# Patient Record
Sex: Female | Born: 1990 | Race: Black or African American | Hispanic: No | Marital: Single | Smoking: Never smoker
Health system: Southern US, Community
[De-identification: ages and names within clinical notes are randomized; demographics above are authoritative.]

## PROBLEM LIST (undated history)

## (undated) ENCOUNTER — Inpatient Hospital Stay (HOSPITAL_COMMUNITY): Payer: Self-pay

## (undated) DIAGNOSIS — B999 Unspecified infectious disease: Secondary | ICD-10-CM

## (undated) DIAGNOSIS — S060XAA Concussion with loss of consciousness status unknown, initial encounter: Secondary | ICD-10-CM

## (undated) DIAGNOSIS — J302 Other seasonal allergic rhinitis: Secondary | ICD-10-CM

## (undated) DIAGNOSIS — J45909 Unspecified asthma, uncomplicated: Secondary | ICD-10-CM

## (undated) DIAGNOSIS — S060X9A Concussion with loss of consciousness of unspecified duration, initial encounter: Secondary | ICD-10-CM

## (undated) DIAGNOSIS — R7611 Nonspecific reaction to tuberculin skin test without active tuberculosis: Secondary | ICD-10-CM

## (undated) HISTORY — PX: NO PAST SURGERIES: SHX2092

## (undated) HISTORY — PX: WISDOM TOOTH EXTRACTION: SHX21

---
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
## 2012-01-01 DIAGNOSIS — J45909 Unspecified asthma, uncomplicated: Secondary | ICD-10-CM | POA: Insufficient documentation

## 2012-01-06 DIAGNOSIS — M4120 Other idiopathic scoliosis, site unspecified: Secondary | ICD-10-CM | POA: Insufficient documentation

## 2012-01-24 ENCOUNTER — Ambulatory Visit: Payer: Self-pay | Admitting: Nurse Practitioner

## 2013-05-17 ENCOUNTER — Encounter: Payer: Self-pay | Admitting: Obstetrics

## 2013-06-03 ENCOUNTER — Encounter (HOSPITAL_BASED_OUTPATIENT_CLINIC_OR_DEPARTMENT_OTHER): Payer: Self-pay | Admitting: Emergency Medicine

## 2013-06-03 ENCOUNTER — Ambulatory Visit (INDEPENDENT_AMBULATORY_CARE_PROVIDER_SITE_OTHER): Admitting: Obstetrics & Gynecology

## 2013-06-03 ENCOUNTER — Encounter: Payer: Self-pay | Admitting: Obstetrics & Gynecology

## 2013-06-03 ENCOUNTER — Emergency Department (HOSPITAL_BASED_OUTPATIENT_CLINIC_OR_DEPARTMENT_OTHER)

## 2013-06-03 ENCOUNTER — Emergency Department (HOSPITAL_BASED_OUTPATIENT_CLINIC_OR_DEPARTMENT_OTHER)
Admission: EM | Admit: 2013-06-03 | Discharge: 2013-06-04 | Disposition: A | Discharge status: Home / Self Care | Attending: Emergency Medicine | Admitting: Emergency Medicine

## 2013-06-03 VITALS — BP 116/71 | HR 73 | Temp 98.4°F | Wt 142.0 lb

## 2013-06-03 DIAGNOSIS — A499 Bacterial infection, unspecified: Secondary | ICD-10-CM | POA: Insufficient documentation

## 2013-06-03 DIAGNOSIS — N760 Acute vaginitis: Secondary | ICD-10-CM | POA: Insufficient documentation

## 2013-06-03 DIAGNOSIS — O98819 Other maternal infectious and parasitic diseases complicating pregnancy, unspecified trimester: Secondary | ICD-10-CM | POA: Insufficient documentation

## 2013-06-03 DIAGNOSIS — O43899 Other placental disorders, unspecified trimester: Secondary | ICD-10-CM

## 2013-06-03 DIAGNOSIS — J45909 Unspecified asthma, uncomplicated: Secondary | ICD-10-CM | POA: Insufficient documentation

## 2013-06-03 DIAGNOSIS — B9689 Other specified bacterial agents as the cause of diseases classified elsewhere: Secondary | ICD-10-CM | POA: Insufficient documentation

## 2013-06-03 DIAGNOSIS — O368990 Maternal care for other specified fetal problems, unspecified trimester, not applicable or unspecified: Secondary | ICD-10-CM | POA: Insufficient documentation

## 2013-06-03 DIAGNOSIS — Z87828 Personal history of other (healed) physical injury and trauma: Secondary | ICD-10-CM | POA: Insufficient documentation

## 2013-06-03 DIAGNOSIS — Z3400 Encounter for supervision of normal first pregnancy, unspecified trimester: Secondary | ICD-10-CM

## 2013-06-03 DIAGNOSIS — O2390 Unspecified genitourinary tract infection in pregnancy, unspecified trimester: Secondary | ICD-10-CM | POA: Insufficient documentation

## 2013-06-03 DIAGNOSIS — O468X1 Other antepartum hemorrhage, first trimester: Secondary | ICD-10-CM

## 2013-06-03 DIAGNOSIS — B3731 Acute candidiasis of vulva and vagina: Secondary | ICD-10-CM | POA: Insufficient documentation

## 2013-06-03 DIAGNOSIS — B373 Candidiasis of vulva and vagina: Secondary | ICD-10-CM | POA: Insufficient documentation

## 2013-06-03 DIAGNOSIS — O418X10 Other specified disorders of amniotic fluid and membranes, first trimester, not applicable or unspecified: Secondary | ICD-10-CM

## 2013-06-03 DIAGNOSIS — Z3490 Encounter for supervision of normal pregnancy, unspecified, unspecified trimester: Secondary | ICD-10-CM

## 2013-06-03 LAB — WET PREP, GENITAL: Trich, Wet Prep: NONE SEEN

## 2013-06-03 LAB — URINALYSIS, ROUTINE W REFLEX MICROSCOPIC
Bilirubin Urine: NEGATIVE
GLUCOSE, UA: NEGATIVE mg/dL
HGB URINE DIPSTICK: NEGATIVE
Ketones, ur: NEGATIVE mg/dL
Nitrite: NEGATIVE
Protein, ur: NEGATIVE mg/dL
Specific Gravity, Urine: 1.017 (ref 1.005–1.030)
UROBILINOGEN UA: 1 mg/dL (ref 0.0–1.0)
pH: 6.5 (ref 5.0–8.0)

## 2013-06-03 LAB — CBC WITH DIFFERENTIAL/PLATELET
Basophils Absolute: 0 10*3/uL (ref 0.0–0.1)
Basophils Relative: 0 % (ref 0–1)
EOS ABS: 0.3 10*3/uL (ref 0.0–0.7)
EOS PCT: 4 % (ref 0–5)
HEMATOCRIT: 32.3 % — AB (ref 36.0–46.0)
Hemoglobin: 11.8 g/dL — ABNORMAL LOW (ref 12.0–15.0)
LYMPHS ABS: 1.7 10*3/uL (ref 0.7–4.0)
LYMPHS PCT: 23 % (ref 12–46)
MCH: 31.2 pg (ref 26.0–34.0)
MCHC: 36.5 g/dL — ABNORMAL HIGH (ref 30.0–36.0)
MCV: 85.4 fL (ref 78.0–100.0)
MONO ABS: 0.6 10*3/uL (ref 0.1–1.0)
MONOS PCT: 8 % (ref 3–12)
NEUTROS ABS: 4.7 10*3/uL (ref 1.7–7.7)
Neutrophils Relative %: 65 % (ref 43–77)
PLATELETS: 222 10*3/uL (ref 150–400)
RBC: 3.78 MIL/uL — ABNORMAL LOW (ref 3.87–5.11)
RDW: 11.2 % — ABNORMAL LOW (ref 11.5–15.5)
WBC: 7.3 10*3/uL (ref 4.0–10.5)

## 2013-06-03 LAB — URINE MICROSCOPIC-ADD ON

## 2013-06-03 LAB — HCG, QUANTITATIVE, PREGNANCY: HCG, BETA CHAIN, QUANT, S: 94698 m[IU]/mL — AB (ref <5)

## 2013-06-03 LAB — ABO/RH: ABO/RH(D): O POS

## 2013-06-03 LAB — PREGNANCY, URINE: Preg Test, Ur: POSITIVE — AB

## 2013-06-03 MED ORDER — METRONIDAZOLE 500 MG PO TABS: 500.0000 mg | ORAL_TABLET | Freq: Two times a day (BID) | ORAL | Status: DC

## 2013-06-03 NOTE — ED Provider Notes (Signed)
TIME SEEN: 9:15 PM  CHIEF COMPLAINT: Abdominal cramping  HPI: Patient is a 23 year old G1 P0 with last menstrual period on March 12th who presents with lower abdominal cramping that started today. She's also had some pink vaginal discharge that may have been blood. She denies any dysuria or hematuria. No fevers, chills, nausea, vomiting or diarrhea. She's had a history of Chlamydia in the past that was treated. She has not yet had OB/GYN followup but has one scheduled. She has not had an ultrasound confirming an IUP.  ROS: See HPI Constitutional: no fever  Eyes: no drainage  ENT: no runny nose   Cardiovascular:  no chest pain  Resp: no SOB  GI: no vomiting GU: no dysuria Integumentary: no rash  Allergy: no hives  Musculoskeletal: no leg swelling  Neurological: no slurred speech ROS otherwise negative  PAST MEDICAL HISTORY/PAST SURGICAL HISTORY:  Past Medical History  Diagnosis Date  . Asthma   . Seasonal allergies   . Positive PPD, treated   . Concussion   . Complication of anesthesia     MEDICATIONS:  Prior to Admission medications   Medication Sig Start Date End Date Taking? Authorizing Provider  ipratropium-albuterol (DUONEB) 0.5-2.5 (3) MG/3ML SOLN Take 3 mLs by nebulization.    Historical Provider, MD    ALLERGIES:  No Known Allergies  SOCIAL HISTORY:  History  Substance Use Topics  . Smoking status: Never Smoker   . Smokeless tobacco: Never Used  . Alcohol Use: No    FAMILY HISTORY: No family history on file.  EXAM: BP 121/75  Pulse 93  Temp(Src) 98.1 F (36.7 C) (Oral)  Resp 18  Ht 5\' 4"  (1.626 m)  Wt 143 lb (64.864 kg)  BMI 24.53 kg/m2  SpO2 99%  LMP 03/25/2013 CONSTITUTIONAL: Alert and oriented and responds appropriately to questions. Well-appearing; well-nourished HEAD: Normocephalic EYES: Conjunctivae clear, PERRL ENT: normal nose; no rhinorrhea; moist mucous membranes; pharynx without lesions noted NECK: Supple, no meningismus, no LAD   CARD: RRR; S1 and S2 appreciated; no murmurs, no clicks, no rubs, no gallops RESP: Normal chest excursion without splinting or tachypnea; breath sounds clear and equal bilaterally; no wheezes, no rhonchi, no rales,  ABD/GI: Normal bowel sounds; non-distended; soft, non-tender, no rebound, no guarding GU:  Normal external genitalia, patient has thick white vaginal discharge with a brownish color, cervix is closed and thick and high, no cervical motion tenderness, no genital lesions, no adnexal tenderness or fullness BACK:  The back appears normal and is non-tender to palpation, there is no CVA tenderness EXT: Normal ROM in all joints; non-tender to palpation; no edema; normal capillary refill; no cyanosis    SKIN: Normal color for age and race; warm NEURO: Moves all extremities equally PSYCH: The patient's mood and manner are appropriate. Grooming and personal hygiene are appropriate.  MEDICAL DECISION MAKING: Patient here with vaginal discharge and possible bleeding while pregnant. She is 10 weeks and 0 days by LMP. Have discussed with patient that concern for threatened abortion versus infection versus ectopic. Will obtain labs, urine, pelvic cultures and transvaginal ultrasound. Patient denies wanting pain medication at this time.  ED PROGRESS: Patient's labs are unremarkable. Her beta hCG is 94,698. Her urine shows small leukocytes but no other sign of infection. Her wet prep is positive for yeast and clue cells. She states she's been taking over-the-counter medication for yeast infection recently. Her ultrasound shows a single viable IUP with gestational age of [redacted] weeks 3 days, but a heart rate of  175. She does have a subchorionic hemorrhage. Have discussed this with patient importance for OB/GYN followup. She reports she is currently on prenatal vitamins. Discussed with patient that she continue over-the-counter medicines for her yeast infection. Given she does have clue cells, will also discharge  with prescription for Flagyl for bacterial vaginosis.  Patient's blood type is O+. She does not need RhoGAM.     Layla MawKristen N Ward, DO 06/03/13 [id number]

## 2013-06-03 NOTE — Discharge Instructions (Signed)
Bacterial Vaginosis Bacterial vaginosis is a vaginal infection that occurs when the normal balance of bacteria in the vagina is disrupted. It results from an overgrowth of certain bacteria. This is the most common vaginal infection in women of childbearing age. Treatment is important to prevent complications, especially in pregnant women, as it can cause a premature delivery. CAUSES  Bacterial vaginosis is caused by an increase in harmful bacteria that are normally present in smaller amounts in the vagina. Several different kinds of bacteria can cause bacterial vaginosis. However, the reason that the condition develops is not fully understood. RISK FACTORS Certain activities or behaviors can put you at an increased risk of developing bacterial vaginosis, including:  Having a new sex partner or multiple sex partners.  Douching.  Using an intrauterine device (IUD) for contraception. Women do not get bacterial vaginosis from toilet seats, bedding, swimming pools, or contact with objects around them. SIGNS AND SYMPTOMS  Some women with bacterial vaginosis have no signs or symptoms. Common symptoms include:  Grey vaginal discharge.  A fishlike odor with discharge, especially after sexual intercourse.  Itching or burning of the vagina and vulva.  Burning or pain with urination. DIAGNOSIS  Your health care provider will take a medical history and examine the vagina for signs of bacterial vaginosis. A sample of vaginal fluid may be taken. Your health care provider will look at this sample under a microscope to check for bacteria and abnormal cells. A vaginal pH test may also be done.  TREATMENT  Bacterial vaginosis may be treated with antibiotic medicines. These may be given in the form of a pill or a vaginal cream. A second round of antibiotics may be prescribed if the condition comes back after treatment.  HOME CARE INSTRUCTIONS   Only take over-the-counter or prescription medicines as  directed by your health care provider.  If antibiotic medicine was prescribed, take it as directed. Make sure you finish it even if you start to feel better.  Do not have sex until treatment is completed.  Tell all sexual partners that you have a vaginal infection. They should see their health care provider and be treated if they have problems, such as a mild rash or itching.  Practice safe sex by using condoms and only having one sex partner. SEEK MEDICAL CARE IF:   Your symptoms are not improving after 3 days of treatment.  You have increased discharge or pain.  You have a fever. MAKE SURE YOU:   Understand these instructions.  Will watch your condition.  Will get help right away if you are not doing well or get worse. FOR MORE INFORMATION  Centers for Disease Control and Prevention, Division of STD Prevention: [employer]raud[employer] American Sexual Health Association (ASHA): www.ashastd.org  Document Released: 12/31/2004 Document Revised: 10/21/2012 Document Reviewed: 08/12/2012 [facility] Patient Information 2014 [place].  Pregnancy - First Trimester During sexual intercourse, millions of sperm go into the vagina. Only 1 sperm will penetrate and fertilize the female egg while it is in the Fallopian tube. One week later, the fertilized egg implants into the wall of the uterus. An embryo begins to develop into a baby. At 6 to 8 weeks, the eyes and face are formed and the heartbeat can be seen on ultrasound. At the end of 12 weeks (first trimester), all the baby's organs are formed. Now that you are pregnant, you will want to do everything you can to have a healthy baby. Two of the most important things are to get good  prenatal care and follow your caregiver's instructions. Prenatal care is all the medical care you receive before the baby's birth. It is given to prevent, find, and treat problems during the pregnancy and childbirth. PRENATAL EXAMS  During prenatal visits, your weight,  blood pressure, and urine are checked. This is done to make sure you are healthy and progressing normally during the pregnancy.  A pregnant woman should gain 25 to 35 pounds during the pregnancy. However, if you are overweight or underweight, your caregiver will advise you regarding your weight.  Your caregiver will ask and answer questions for you.  Blood work, cervical cultures, other necessary tests, and a Pap test are done during your prenatal exams. These tests are done to check on your health and the probable health of your baby. Tests are strongly recommended and done for HIV with your permission. This is the virus that causes AIDS. These tests are done because medicines can be given to help prevent your baby from being born with this infection should you have been infected without knowing it. Blood work is also used to find out your blood type, previous infections, and follow your blood levels (hemoglobin).  Low hemoglobin (anemia) is common during pregnancy. Iron and vitamins are given to help prevent this. Later in the pregnancy, blood tests for diabetes will be done along with any other tests if any problems develop.  You may need other tests to make sure you and the baby are doing well. CHANGES DURING THE FIRST TRIMESTER  Your body goes through many changes during pregnancy. They vary from person to person. Talk to your caregiver about changes you notice and are concerned about. Changes can include:  Your menstrual period stops.  The egg and sperm carry the genes that determine what you look like. Genes from you and your partner are forming a baby. The female genes determine whether the baby is a boy or a girl.  Your body increases in girth and you may feel bloated.  Feeling sick to your stomach (nauseous) and throwing up (vomiting). If the vomiting is uncontrollable, call your caregiver.  Your breasts will begin to enlarge and become tender.  Your nipples may stick out more and  become darker.  The need to urinate more. Painful urination may mean you have a bladder infection.  Tiring easily.  Loss of appetite.  Cravings for certain kinds of food.  At first, you may gain or lose a couple of pounds.  You may have changes in your emotions from day to day (excited to be pregnant or concerned something may go wrong with the pregnancy and baby).  You may have more vivid and strange dreams. HOME CARE INSTRUCTIONS   It is very important to avoid all smoking, alcohol and non-prescribed drugs during your pregnancy. These affect the formation and growth of the baby. Avoid chemicals while pregnant to ensure the delivery of a healthy infant.  Start your prenatal visits by the 12th week of pregnancy. They are usually scheduled monthly at first, then more often in the last 2 months before delivery. Keep your caregiver's appointments. Follow your caregiver's instructions regarding medicine use, blood and lab tests, exercise, and diet.  During pregnancy, you are providing food for you and your baby. Eat regular, well-balanced meals. Choose foods such as meat, fish, milk and other low fat dairy products, vegetables, fruits, and whole-grain breads and cereals. Your caregiver will tell you of the ideal weight gain.  You can help morning sickness by keeping  soda crackers at the bedside. Eat a couple before arising in the morning. You may want to use the crackers without salt on them.  Eating 4 to 5 small meals rather than 3 large meals a day also may help the nausea and vomiting.  Drinking liquids between meals instead of during meals also seems to help nausea and vomiting.  A physical sexual relationship may be continued throughout pregnancy if there are no other problems. Problems may be early (premature) leaking of amniotic fluid from the membranes, vaginal bleeding, or belly (abdominal) pain.  Exercise regularly if there are no restrictions. Check with your caregiver or  physical therapist if you are unsure of the safety of some of your exercises. Greater weight gain will occur in the last 2 trimesters of pregnancy. Exercising will help:  Control your weight.  Keep you in shape.  Prepare you for labor and delivery.  Help you lose your pregnancy weight after you deliver your baby.  Wear a good support or jogging bra for breast tenderness during pregnancy. This may help if worn during sleep too.  Ask when prenatal classes are available. Begin classes when they are offered.  Do not use hot tubs, steam rooms, or saunas.  Wear your seat belt when driving. This protects you and your baby if you are in an accident.  Avoid raw meat, uncooked cheese, cat litter boxes, and soil used by cats throughout the pregnancy. These carry germs that can cause birth defects in the baby.  The first trimester is a good time to visit your dentist for your dental health. Getting your teeth cleaned is okay. Use a softer toothbrush and brush gently during pregnancy.  Ask for help if you have financial, counseling, or nutritional needs during pregnancy. Your caregiver will be able to offer counseling for these needs as well as refer you for other special needs.  Do not take any medicines or herbs unless told by your caregiver.  Inform your caregiver if there is any mental or physical domestic violence.  Make a list of emergency phone numbers of family, friends, hospital, and police and fire departments.  Write down your questions. Take them to your prenatal visit.  Do not douche.  Do not cross your legs.  If you have to stand for long periods of time, rotate you feet or take small steps in a circle.  You may have more vaginal secretions that may require a sanitary pad. Do not use tampons or scented sanitary pads. MEDICINES AND DRUG USE IN PREGNANCY  Take prenatal vitamins as directed. The vitamin should contain 1 milligram of folic acid. Keep all vitamins out of reach of  children. Only a couple vitamins or tablets containing iron may be fatal to a baby or young child when ingested.  Avoid use of all medicines, including herbs, over-the-counter medicines, not prescribed or suggested by your caregiver. Only take over-the-counter or prescription medicines for pain, discomfort, or fever as directed by your caregiver. Do not use aspirin, ibuprofen, or naproxen unless directed by your caregiver.  Let your caregiver also know about herbs you may be using.  Alcohol is related to a number of birth defects. This includes fetal alcohol syndrome. All alcohol, in any form, should be avoided completely. Smoking will cause low birth rate and premature babies.  Street or illegal drugs are very harmful to the baby. They are absolutely forbidden. A baby born to an addicted mother will be addicted at birth. The baby will go through the  same withdrawal an adult does.  Let your caregiver know about any medicines that you have to take and for what reason you take them. SEEK MEDICAL CARE IF:  You have any concerns or worries during your pregnancy. It is better to call with your questions if you feel they cannot wait, rather than worry about them. SEEK IMMEDIATE MEDICAL CARE IF:   An unexplained oral temperature above 102 F (38.9 C) develops, or as your caregiver suggests.  You have leaking of fluid from the vagina (birth canal). If leaking membranes are suspected, take your temperature and inform your caregiver of this when you call.  There is vaginal spotting or bleeding. Notify your caregiver of the amount and how many pads are used.  You develop a bad smelling vaginal discharge with a change in the color.  You continue to feel sick to your stomach (nauseated) and have no relief from remedies suggested. You vomit blood or coffee ground-like materials.  You lose more than 2 pounds of weight in 1 week.  You gain more than 2 pounds of weight in 1 week and you notice swelling of  your face, hands, feet, or legs.  You gain 5 pounds or more in 1 week (even if you do not have swelling of your hands, face, legs, or feet).  You get exposed to [place] measles and have never had them.  You are exposed to fifth disease or chickenpox.  You develop belly (abdominal) pain. Round ligament discomfort is a common non-cancerous (benign) cause of abdominal pain in pregnancy. Your caregiver still must evaluate this.  You develop headache, fever, diarrhea, pain with urination, or shortness of breath.  You fall or are in a car accident or have any kind of trauma.  There is mental or physical violence in your home. Document Released: 12/25/2000 Document Revised: 09/25/2011 Document Reviewed: 06/28/2008 [facility] Patient Information 2014 [employer] [place]. Threatened Miscarriage Bleeding during the first 20 weeks of pregnancy is common. This is sometimes called a threatened miscarriage. This is a pregnancy that is threatening to end before the twentieth week of pregnancy. Often this bleeding stops with bed rest or decreased activities as suggested by your caregiver and the pregnancy continues without any more problems. You may be asked to not have sexual intercourse, have orgasms or use tampons until further notice. Sometimes a threatened miscarriage can progress to a complete or incomplete miscarriage. This may or may not require further treatment. Some miscarriages occur before a woman misses a menstrual period and knows she is pregnant. Miscarriages occur in 15 to 20% of all pregnancies and usually occur during the first 13 weeks of the pregnancy. The exact cause of a miscarriage is usually never known. A miscarriage is natures way of ending a pregnancy that is abnormal or would not make it to term. There are some things that may put you at risk to have a miscarriage, such as:  Hormone problems.  Infection of the uterus or cervix.  Chronic illness, diabetes for example, especially if it  is not controlled.  Abnormal shaped uterus.  Fibroids in the uterus.  Incompetent cervix (the cervix is too weak to hold the baby).  Smoking.  Drinking too much alcohol. It's best not to drink any alcohol when you are pregnant.  Taking illegal drugs. TREATMENT  When a miscarriage becomes complete and all products of conception (all the tissue in the uterus) have been passed, often no treatment is needed. If you think you passed tissue, save it in  a container and take it to your doctor for evaluation. If the miscarriage is incomplete (parts of the fetus or placenta remain in the uterus), further treatment may be needed. The most common reason for further treatment is continued bleeding (hemorrhage) because pregnancy tissue did not pass out of the uterus. This often occurs if a miscarriage is incomplete. Tissue left behind may also become infected. Treatment usually is dilatation and curettage (the removal of the remaining products of pregnancy. This can be done by a simple sucking procedure (suction curettage) or a simple scraping of the inside of the uterus. This may be done in the hospital or in the caregiver's office. This is only done when your caregiver knows that there is no chance for the pregnancy to proceed to term. This is determined by physical examination, negative pregnancy test, falling pregnancy hormone count and/or, an ultrasound revealing a dead fetus. Miscarriages are often a very emotional time for prospective mothers and fathers. This is not you or your partners fault. It did not occur because of an inadequacy in you or your partner. Nearly all miscarriages occur because the pregnancy has started off wrongly. At least half of these pregnancies have a chromosomal abnormality. It is almost always not inherited. Others may have developmental problems with the fetus or placenta. This does not always show up even when the products miscarried are studied under the microscope. The  miscarriage is nearly always not your fault and it is not likely that you could have prevented it from happening. If you are having emotional and grieving problems, talk to your health care provider and even seek counseling, if necessary, before getting pregnant again. You can begin trying for another pregnancy as soon as your caregiver says it is OK. HOME CARE INSTRUCTIONS   Your caregiver may order bed rest depending on how much bleeding and cramping you are having. You may be limited to only getting up to go to the bathroom. You may be allowed to continue light activity. You may need to make arrangements for the care of your other children and for any other responsibilities.  Keep track of the number of pads you use each day, how often you have to change pads and how saturated (soaked) they are. Record this information.  DO NOT USE TAMPONS. Do not douche, have sexual intercourse or orgasms until approved by your caregiver.  You may receive a follow up appointment for re-evaluation of your pregnancy and a repeat blood test. Re-evaluation often occurs after 2 days and again in 4 to 6 weeks. It is very important that you follow-up in the recommended time period.  If you are Rh negative and the father is Rh positive or you do not know the fathers' blood type, you may receive a shot (Rh immune globulin) to help prevent abnormal antibodies that can develop and affect the baby in any future pregnancies. SEEK IMMEDIATE MEDICAL CARE IF:  You have severe cramps in your stomach, back, or abdomen.  You have a sudden onset of severe pain in the lower part of your abdomen.  You develop chills.  You run an unexplained temperature of 101 F (38.3 C) or higher.  You pass large clots or tissue. Save any tissue for your caregiver to inspect.  Your bleeding increases or you become light-headed, weak, or have fainting episodes.  You have a gush of fluid from your vagina.  You pass out. This could mean you  have a tubal (ectopic) pregnancy. Document Released: 12/31/2004 Document  Revised: 03/25/2011 Document Reviewed: 08/17/2007 [facility] Patient Information 2014 [place], [place].

## 2013-06-03 NOTE — ED Notes (Signed)
abd cramping off and on x 5 weeks,  Worse last 2.5 hours,  vad dc onset yesterday pinkish  And today dc was red

## 2013-06-04 LAB — GC/CHLAMYDIA PROBE AMP
CT PROBE, AMP APTIMA: NEGATIVE
GC PROBE AMP APTIMA: NEGATIVE

## 2013-06-08 ENCOUNTER — Telehealth: Payer: Self-pay | Admitting: *Deleted

## 2013-06-08 NOTE — Telephone Encounter (Signed)
Patients mother called and stated patient had been seen in the ER the night before for stomach pains, cramping and discharge. Patients mother states she would like to know what over the counter medication she could use for a yeast infection. Patients mother states that the ER told her over the counter treatment was fine. I called the patient and advised her to use Mono stat and also advised the patient that if symptoms did not improve to give [place] a call.

## 2013-06-21 ENCOUNTER — Encounter: Payer: Self-pay | Admitting: Obstetrics & Gynecology

## 2013-06-21 ENCOUNTER — Ambulatory Visit (INDEPENDENT_AMBULATORY_CARE_PROVIDER_SITE_OTHER): Admitting: Obstetrics & Gynecology

## 2013-06-21 VITALS — BP 131/71 | HR 75 | Temp 98.6°F | Wt 144.0 lb

## 2013-06-21 DIAGNOSIS — Z3400 Encounter for supervision of normal first pregnancy, unspecified trimester: Secondary | ICD-10-CM | POA: Insufficient documentation

## 2013-06-21 DIAGNOSIS — B379 Candidiasis, unspecified: Secondary | ICD-10-CM

## 2013-06-21 DIAGNOSIS — Z3201 Encounter for pregnancy test, result positive: Secondary | ICD-10-CM

## 2013-06-21 LAB — POCT URINALYSIS DIPSTICK
Blood, UA: NEGATIVE
Glucose, UA: NEGATIVE
Ketones, UA: NEGATIVE
Leukocytes, UA: NEGATIVE
Nitrite, UA: NEGATIVE
PH UA: 6
Protein, UA: NEGATIVE
Spec Grav, UA: 1.01

## 2013-06-21 MED ORDER — TERCONAZOLE 0.4 % VA CREA: 1.0000 | TOPICAL_CREAM | Freq: Every day | VAGINAL | Status: DC

## 2013-06-21 NOTE — Progress Notes (Signed)
Subjective:    Victoria Weber is being seen today for her first obstetrical visit.  This is not a planned pregnancy. She is at 7686w4d gestation. Relationship with FOB: significant other, not living together. Patient does intend to breast feed. Pregnancy history fully reviewed.  Menstrual History: OB History   Grav Para Term Preterm Abortions TAB SAB Ect Mult Living   1                Patient's last menstrual period was 03/25/2013.    Past Medical History  Diagnosis Date  . Asthma   . Seasonal allergies   . Positive PPD, treated   . Concussion     Past Surgical History  Procedure Laterality Date  . Wisdom tooth extraction       (Not in a hospital admission) No Known Allergies  History  Substance Use Topics  . Smoking status: Never Smoker   . Smokeless tobacco: Never Used  . Alcohol Use: No    History reviewed. No pertinent family history.   Review of Systems Constitutional: negative for weight loss Gastrointestinal: negative for vomiting Genitourinary:negative for genital lesions and vaginal discharge and dysuria Musculoskeletal:negative for back pain Behavioral/Psych: negative for abusive relationship, depression, illegal drug usage and tobacco use    Objective:     General Appearance:    Alert, cooperative, no distress, appears stated age  Head:    Normocephalic, without obvious abnormality, atraumatic  Eyes:    PERRL, conjunctiva/corneas clear, EOM's intact, fundi    benign, both eyes  Ears:    Normal TM's and external ear canals, both ears  Nose:   Nares normal, septum midline, mucosa normal, no drainage    or sinus tenderness  Throat:   Lips, mucosa, and tongue normal; teeth and gums normal  Neck:   Supple, symmetrical, trachea midline, no adenopathy;    thyroid:  no enlargement/tenderness/nodules; no carotid   bruit or JVD  Back:     Symmetric, no curvature, ROM normal, no CVA tenderness  Lungs:     Clear to auscultation bilaterally, respirations unlabored   Chest Wall:    No tenderness or deformity   Heart:    Regular rate and rhythm, S1 and S2 normal, no murmur, rub   or gallop  Breast Exam:    No tenderness, masses, or nipple abnormality  Abdomen:     Soft, non-tender, bowel sounds active all four quadrants,    no masses, no organomegaly  Genitalia:    Fissuring at 6 o'clock; cheesy white discharge  Extremities:   Extremities normal, atraumatic, no cyanosis or edema  Pulses:   2+ and symmetric all extremities  Skin:   Skin color, texture, turgor normal, no rashes or lesions  Lymph nodes:   Cervical, supraclavicular, and axillary nodes normal  Neurologic:   CNII-XII intact, normal strength, sensation and reflexes    throughout      Lab Review Urine pregnancy test Labs reviewed yes Radiologic studies reviewed yes Assessment:    Pregnancy at 3586w4d weeks   Likely candida vulvovaginitis Plan:      Prenatal vitamins.  Counseling provided regarding continued use of seat belts, cessation of alcohol consumption, smoking or use of illicit drugs; infection precautions i.e., influenza/TDAP immunizations, toxoplasmosis,CMV, parvovirus, listeria and varicella; workplace safety, exercise during pregnancy; routine dental care, safe medications, sexual activity, hot tubs, saunas, pools, travel, caffeine use, fish and methlymercury, potential toxins, hair treatments, varicose veins Weight gain recommendations per IOM guidelines reviewed: normal weight/BMI 18.5 - 24.9--> gain  25 - 35 lbs Problem list reviewed and updated. FIRST/CF mutation testing/NIPT/QUAD SCREEN/fragile X/Spinal muscular atrophy discussed. Role of ultrasound in pregnancy discussed. Amniocentesis discussed: not indicated. Testing for candida/BV/HSV Meds ordered this encounter  Medications  . Prenatal Multivit-Min-Fe-FA (PRENATAL VITAMINS PO)    Sig: Take by mouth.   Orders Placed This Encounter  Procedures  . Culture, OB Urine  . Obstetric panel  . HIV antibody  .  Hemoglobinopathy evaluation  . Vit D  25 hydroxy (rtn osteoporosis monitoring)  . Varicella zoster antibody, IgG  . POCT urinalysis dipstick    Follow up in 4 weeks.

## 2013-06-22 LAB — OBSTETRIC PANEL
Antibody Screen: NEGATIVE
BASOS ABS: 0 10*3/uL (ref 0.0–0.1)
Basophils Relative: 0 % (ref 0–1)
Eosinophils Absolute: 0.2 10*3/uL (ref 0.0–0.7)
Eosinophils Relative: 4 % (ref 0–5)
HCT: 34.3 % — ABNORMAL LOW (ref 36.0–46.0)
HEMOGLOBIN: 12.3 g/dL (ref 12.0–15.0)
HEP B S AG: NEGATIVE
Lymphocytes Relative: 23 % (ref 12–46)
Lymphs Abs: 1.3 10*3/uL (ref 0.7–4.0)
MCH: 30.8 pg (ref 26.0–34.0)
MCHC: 35.9 g/dL (ref 30.0–36.0)
MCV: 85.8 fL (ref 78.0–100.0)
MONOS PCT: 5 % (ref 3–12)
Monocytes Absolute: 0.3 10*3/uL (ref 0.1–1.0)
NEUTROS PCT: 68 % (ref 43–77)
Neutro Abs: 3.9 10*3/uL (ref 1.7–7.7)
Platelets: 226 10*3/uL (ref 150–400)
RBC: 4 MIL/uL (ref 3.87–5.11)
RDW: 13.2 % (ref 11.5–15.5)
Rh Type: POSITIVE
Rubella: 2.67 Index — ABNORMAL HIGH (ref <0.90)
WBC: 5.7 10*3/uL (ref 4.0–10.5)

## 2013-06-22 LAB — VARICELLA ZOSTER ANTIBODY, IGG: Varicella IgG: 1198 Index — ABNORMAL HIGH (ref <135.00)

## 2013-06-22 LAB — HIV ANTIBODY (ROUTINE TESTING W REFLEX): HIV 1&2 Ab, 4th Generation: NONREACTIVE

## 2013-06-22 LAB — VITAMIN D 25 HYDROXY (VIT D DEFICIENCY, FRACTURES): Vit D, 25-Hydroxy: 31 ng/mL (ref 30–89)

## 2013-06-22 NOTE — Patient Instructions (Signed)
Prenatal Care  WHAT IS PRENATAL CARE?  Prenatal care means health care during your pregnancy, before your baby is born. It is very important to take care of yourself and your baby during your pregnancy by:   Getting early prenatal care. If you know you are pregnant, or think you might be pregnant, call your health care provider as soon as possible. Schedule a visit for a prenatal exam.  Getting regular prenatal care. Follow your health care provider's schedule for blood and other necessary tests. Do not miss appointments.  Doing everything you can to keep yourself and your baby healthy during your pregnancy.  Getting complete care. Prenatal care should include evaluation of the medical, dietary, educational, psychological, and social needs of you and your significant other. The medical and genetic history of your family and the family of your baby's father should be discussed with your health care provider.  Discussing with your health care provider:  Prescription, over-the-counter, and herbal medicines that you take.  Any history of substance abuse, alcohol use, smoking, and illegal drug use.  Any history of domestic abuse and violence.  Immunizations you have received.  Your nutrition and diet.  The amount of exercise you do.  Any environmental and occupational hazards to which you are exposed.  History of sexually transmitted infections for both you and your partner.  Previous pregnancies you have had. WHY IS PRENATAL CARE SO IMPORTANT?  By regularly seeing your health care provider, you help ensure that problems can be identified early so that they can be treated as soon as possible. Other problems might be prevented. Many studies have shown that early and regular prenatal care is important for the health of mothers and their babies.  HOW CAN I TAKE CARE OF MYSELF WHILE I AM PREGNANT?  Here are ways to take care of yourself and your baby:   Start or continue taking your  multivitamin with 400 micrograms (mcg) of folic acid every day.  Get early and regular prenatal care. It is very important to see a health care provider during your pregnancy. Your health care provider will check at each visit to make sure that you and the baby are healthy. If there are any problems, action can be taken right away to help you and the baby.  Eat a healthy diet that includes:  Fruits.  Vegetables.  Foods low in saturated fat.  Whole grains.  Calcium-rich foods, such as milk, yogurt, and hard cheeses.  Drink 6 to 8 glasses of liquids a day.  Unless your health care provider tells you not to, try to be physically active for 30 minutes, most days of the week. If you are pressed for time, you can get your activity in through 10-minute segments, three times a day.  Do not smoke, drink alcohol, or use drugs. These can cause long-term damage to your baby. Talk with your health care provider about steps to take to stop smoking. Talk with a member of your faith community, a counselor, a trusted friend, or your health care provider if you are concerned about your alcohol or drug use.  Ask your health care provider before taking any medicine, even over-the-counter medicines. Some medicines are not safe to take during pregnancy.  Get plenty of rest and sleep.  Avoid hot tubs and saunas during pregnancy.  Do not have X-rays taken unless absolutely necessary and with the recommendation of your health care provider. A lead shield can be placed on your abdomen to protect the  baby when X-rays are taken in other parts of the body.  Do not empty the cat litter when you are pregnant. It may contain a parasite that causes an infection called toxoplasmosis, which can cause birth defects. Also, use gloves when working in garden areas used by cats.  Do not eat uncooked or undercooked meats or fish.  Do not eat soft, mold-ripened cheeses (Brie, Camembert, and chevre) or soft, blue-veined  cheese (Danish blue and Roquefort).  Stay away from toxic chemicals like:  Insecticides.  Solvents (some cleaners or paint thinners).  Lead.  Mercury.  Sexual intercourse may continue until the end of the pregnancy, unless you have a medical problem or there is a problem with the pregnancy and your health care provider tells you not to.  Do not wear high-heel shoes, especially during the second half of the pregnancy. You can lose your balance and fall.  Do not take long trips, unless absolutely necessary. Be sure to see your health care provider before going on the trip.  Do not sit in one position for more than 2 hours when on a trip.  Take a copy of your medical records when going on a trip. Know where a hospital is located in the city you are visiting, in case of an emergency.  Most dangerous household products will have pregnancy warnings on their labels. Ask your health care provider about products if you are unsure.  Limit or eliminate your caffeine intake from coffee, tea, sodas, medicines, and chocolate.  Many women continue working through pregnancy. Staying active might help you stay healthier. If you have a question about the safety or the hours you work at your particular job, talk with your health care provider.  Get informed:  Read books.  Watch videos.  Go to childbirth classes for you and your significant other.  Talk with experienced moms.  Ask your health care provider about childbirth education classes for you and your partner. Classes can help you and your partner prepare for the birth of your baby.  Ask about a baby doctor (pediatrician) and methods and pain medicine for labor, delivery, and possible cesarean delivery. HOW OFTEN SHOULD I SEE MY HEALTH CARE PROVIDER DURING PREGNANCY?  Your health care provider will give you a schedule for your prenatal visits. You will have visits more often as you get closer to the end of your pregnancy. An average  pregnancy lasts about 40 weeks.  A typical schedule includes visiting your health care provider:   About once each month during your first 6 months of pregnancy.  Every 2 weeks during the next 2 months.  Weekly in the last month, until the delivery date. Your health care provider will probably want to see you more often if:  You are older than 35 years.  Your pregnancy is high risk because you have certain health problems or problems with the pregnancy, such as:  Diabetes.  High blood pressure.  The baby is not growing on schedule, according to the dates of the pregnancy. Your health care provider will do special tests to make sure you and the baby are not having any serious problems. WHAT HAPPENS DURING PRENATAL VISITS?   At your first prenatal visit, your health care provider will do a physical exam and talk to you about your health history and the health history of your partner and your family. Your health care provider will be able to tell you what date to expect your baby to be born on.  Your first physical exam will include checks of your blood pressure, measurements of your height and weight, and an exam of your pelvic organs. Your health care provider will do a Pap test if you have not had one recently and will do cultures of your cervix to make sure there is no infection.  At each prenatal visit, there will be tests of your blood, urine, blood pressure, weight, and checking the progress of the baby.  At your later prenatal visits, your health care provider will check how you are doing and how the baby is developing. You may have a number of tests done as your pregnancy progresses.  Ultrasound exams are often used to check on the baby's growth and health.  You may have more urine and blood tests, as well as special tests, if needed. These may include amniocentesis to examine fluid in the pregnancy sac, stress tests to check how the baby responds to contractions, or a  biophysical profile to measure fetus well-being. Your health care provider will explain the tests and why they are necessary.  You should discuss with your health care provider your plans to breastfeed or bottle-feed your baby.  Each visit is also a chance for you to learn about staying healthy during pregnancy and to ask questions. Document Released: 01/03/2003 Document Revised: 10/21/2012 Document Reviewed: 06/18/2012 [facility] Patient Information 2014 [place], [place]. Vaginitis Vaginitis is an inflammation of the vagina. It is most often caused by a change in the normal balance of the bacteria and yeast that live in the vagina. This change in balance causes an overgrowth of certain bacteria or yeast, which causes the inflammation. There are different types of vaginitis, but the most common types are:  Bacterial vaginosis.  Yeast infection (candidiasis).  Trichomoniasis vaginitis. This is a sexually transmitted infection (STI).  Viral vaginitis.  Atropic vaginitis.  Allergic vaginitis. CAUSES  The cause depends on the type of vaginitis. Vaginitis can be caused by:  Bacteria (bacterial vaginosis).  Yeast (yeast infection).  A parasite (trichomoniasis vaginitis)  A virus (viral vaginitis).  Low hormone levels (atrophic vaginitis). Low hormone levels can occur during pregnancy, breastfeeding, or after menopause.  Irritants, such as bubble baths, scented tampons, and feminine sprays (allergic vaginitis). Other factors can change the normal balance of the yeast and bacteria that live in the vagina. These include:  Antibiotic medicines.  Poor hygiene.  Diaphragms, vaginal sponges, spermicides, birth control pills, and intrauterine devices (IUD).  Sexual intercourse.  Infection.  Uncontrolled diabetes.  A weakened immune system. SYMPTOMS  Symptoms can vary depending on the cause of the vaginitis. Common symptoms include:  Abnormal vaginal discharge.  The discharge is  white, gray, or yellow with bacterial vaginosis.  The discharge is thick, white, and cheesy with a yeast infection.  The discharge is frothy and yellow or greenish with trichomoniasis.  A bad vaginal odor.  The odor is fishy with bacterial vaginosis.  Vaginal itching, pain, or swelling.  Painful intercourse.  Pain or burning when urinating. Sometimes, there are no symptoms. TREATMENT  Treatment will vary depending on the type of infection.   Bacterial vaginosis and trichomoniasis are often treated with antibiotic creams or pills.  Yeast infections are often treated with antifungal medicines, such as vaginal creams or suppositories.  Viral vaginitis has no cure, but symptoms can be treated with medicines that relieve discomfort. Your sexual partner should be treated as well.  Atrophic vaginitis may be treated with an estrogen cream, pill, suppository, or vaginal ring. If  vaginal dryness occurs, lubricants and moisturizing creams may help. You may be told to avoid scented soaps, sprays, or douches.  Allergic vaginitis treatment involves quitting the use of the product that is causing the problem. Vaginal creams can be used to treat the symptoms. HOME CARE INSTRUCTIONS   Take all medicines as directed by your caregiver.  Keep your genital area clean and dry. Avoid soap and only rinse the area with water.  Avoid douching. It can remove the healthy bacteria in the vagina.  Do not use tampons or have sexual intercourse until your vaginitis has been treated. Use sanitary pads while you have vaginitis.  Wipe from front to back. This avoids the spread of bacteria from the rectum to the vagina.  Let air reach your genital area.  Wear cotton underwear to decrease moisture buildup.  Avoid wearing underwear while you sleep until your vaginitis is gone.  Avoid tight pants and underwear or nylons without a cotton panel.  Take off wet clothing (especially bathing suits) as soon as  possible.  Use mild, non-scented products. Avoid using irritants, such as:  Scented feminine sprays.  Fabric softeners.  Scented detergents.  Scented tampons.  Scented soaps or bubble baths.  Practice safe sex and use condoms. Condoms may prevent the spread of trichomoniasis and viral vaginitis. SEEK MEDICAL CARE IF:   You have abdominal pain.  You have a fever or persistent symptoms for more than 2 3 days.  You have a fever and your symptoms suddenly get worse. Document Released: 10/28/2006 Document Revised: 09/25/2011 Document Reviewed: 06/13/2011 [facility] Patient Information 2014 [place], [place].

## 2013-06-23 LAB — HEMOGLOBINOPATHY EVALUATION
HEMOGLOBIN OTHER: 0 %
HGB A2 QUANT: 3.1 % (ref 2.2–3.2)
HGB F QUANT: 0 % (ref 0.0–2.0)
HGB S QUANTITAION: 0 %
Hgb A: 96.9 % (ref 96.8–97.8)

## 2013-06-23 LAB — CULTURE, OB URINE
COLONY COUNT: NO GROWTH
Organism ID, Bacteria: NO GROWTH

## 2013-07-12 ENCOUNTER — Encounter (HOSPITAL_BASED_OUTPATIENT_CLINIC_OR_DEPARTMENT_OTHER): Payer: Self-pay | Admitting: Emergency Medicine

## 2013-07-12 ENCOUNTER — Emergency Department (HOSPITAL_BASED_OUTPATIENT_CLINIC_OR_DEPARTMENT_OTHER)

## 2013-07-12 ENCOUNTER — Emergency Department (HOSPITAL_BASED_OUTPATIENT_CLINIC_OR_DEPARTMENT_OTHER)
Admission: EM | Admit: 2013-07-12 | Discharge: 2013-07-13 | Disposition: A | Discharge status: Home / Self Care | Attending: Emergency Medicine | Admitting: Emergency Medicine

## 2013-07-12 DIAGNOSIS — Z79899 Other long term (current) drug therapy: Secondary | ICD-10-CM | POA: Insufficient documentation

## 2013-07-12 DIAGNOSIS — R1084 Generalized abdominal pain: Secondary | ICD-10-CM | POA: Insufficient documentation

## 2013-07-12 DIAGNOSIS — J45909 Unspecified asthma, uncomplicated: Secondary | ICD-10-CM | POA: Insufficient documentation

## 2013-07-12 DIAGNOSIS — N898 Other specified noninflammatory disorders of vagina: Secondary | ICD-10-CM | POA: Insufficient documentation

## 2013-07-12 DIAGNOSIS — O9989 Other specified diseases and conditions complicating pregnancy, childbirth and the puerperium: Secondary | ICD-10-CM | POA: Insufficient documentation

## 2013-07-12 DIAGNOSIS — Z87820 Personal history of traumatic brain injury: Secondary | ICD-10-CM | POA: Insufficient documentation

## 2013-07-12 LAB — URINALYSIS, ROUTINE W REFLEX MICROSCOPIC
Bilirubin Urine: NEGATIVE
Glucose, UA: NEGATIVE mg/dL
HGB URINE DIPSTICK: NEGATIVE
KETONES UR: NEGATIVE mg/dL
Leukocytes, UA: NEGATIVE
NITRITE: NEGATIVE
PH: 7 (ref 5.0–8.0)
Protein, ur: NEGATIVE mg/dL
SPECIFIC GRAVITY, URINE: 1.008 (ref 1.005–1.030)
Urobilinogen, UA: 0.2 mg/dL (ref 0.0–1.0)

## 2013-07-12 LAB — WET PREP, GENITAL
TRICH WET PREP: NONE SEEN
Yeast Wet Prep HPF POC: NONE SEEN

## 2013-07-12 LAB — PREGNANCY, URINE: Preg Test, Ur: POSITIVE — AB

## 2013-07-12 NOTE — ED Notes (Signed)
Pt c/o lower abd pain 16 weeks preg , with prenatal care. Denies vaginal bleeding

## 2013-07-12 NOTE — ED Provider Notes (Signed)
CSN: [id number]     Arrival date & time 07/12/13  2050 History   First MD Initiated Contact with Patient 07/12/13 2131     Chief Complaint  Patient presents with  . Abdominal Cramping     (Consider location/radiation/quality/duration/timing/severity/associated sxs/prior Treatment) Patient is a 23 y.o. female presenting with cramps. The history is provided by the patient. No language interpreter was used.  Abdominal Cramping Pain location:  Suprapubic Pain quality: aching   Pain radiates to:  Does not radiate Pain severity:  Mild Onset quality:  Gradual Timing:  Constant Progression:  Worsening Chronicity:  New Worsened by:  Nothing tried Ineffective treatments:  None tried   Past Medical History  Diagnosis Date  . Asthma   . Seasonal allergies   . Positive PPD, treated   . Concussion    Past Surgical History  Procedure Laterality Date  . Wisdom tooth extraction     History reviewed. No pertinent family history. History  Substance Use Topics  . Smoking status: Never Smoker   . Smokeless tobacco: Never Used  . Alcohol Use: No   OB History   Grav Para Term Preterm Abortions TAB SAB Ect Mult Living   1              Review of Systems  Gastrointestinal: Positive for abdominal pain.  All other systems reviewed and are negative.     Allergies  Review of patient's allergies indicates no known allergies.  Home Medications   Prior to Admission medications   Medication Sig Start Date End Date Taking? Authorizing Provider  ipratropium-albuterol (DUONEB) 0.5-2.5 (3) MG/3ML SOLN Take 3 mLs by nebulization.    Historical Provider, MD  Prenatal Multivit-Min-Fe-FA (PRENATAL VITAMINS PO) Take by mouth.    Historical Provider, MD  terconazole (TERAZOL 7) 0.4 % vaginal cream Place 1 applicator vaginally at bedtime. For 2 weeks 06/21/13   Antionette CharLisa Jackson-Moore, MD   BP 111/61  Pulse 72  Temp(Src) 98.8 F (37.1 C) (Oral)  Resp 20  SpO2 100%  LMP 03/25/2013 Physical Exam   Nursing note and vitals reviewed. Constitutional: She is oriented to person, place, and time. She appears well-developed and well-nourished.  HENT:  Head: Normocephalic.  Eyes: Conjunctivae and EOM are normal. Pupils are equal, round, and reactive to light.  Neck: Normal range of motion.  Cardiovascular: Normal rate.   Pulmonary/Chest: Effort normal and breath sounds normal.  Abdominal: Soft. She exhibits no distension. There is tenderness.  Genitourinary: Vaginal discharge found.  Musculoskeletal: Normal range of motion.  Neurological: She is alert and oriented to person, place, and time.  Skin: Skin is warm.  Psychiatric: She has a normal mood and affect.    ED Course  Procedures (including critical care time) Labs Review Labs Reviewed  PREGNANCY, URINE - Abnormal; Notable for the following:    Preg Test, Ur POSITIVE (*)    All other components within normal limits  WET PREP, GENITAL  GC/CHLAMYDIA PROBE AMP  URINALYSIS, ROUTINE W REFLEX MICROSCOPIC    Imaging Review No results found.   EKG Interpretation None      MDM  Ultrasound normal,  Few clue,  No yeast   Final diagnoses:  Generalized abdominal pain        Elson AreasLeslie K Sofia, PA-C 07/13/13 0109  Elson AreasLeslie K Sofia, PA-C 07/13/13 0125  Lonia SkinnerLeslie K RobesoniaSofia, PA-C 07/13/13 0126  Lonia SkinnerLeslie K WindcrestSofia, [place]-C 07/13/13 1302

## 2013-07-13 LAB — GC/CHLAMYDIA PROBE AMP
CT PROBE, AMP APTIMA: NEGATIVE
GC Probe RNA: NEGATIVE

## 2013-07-13 NOTE — ED Provider Notes (Signed)
Medical screening examination/treatment/procedure(s) were performed by non-physician practitioner and as supervising physician I was immediately available for consultation/collaboration.    Jon Knapp, MD 07/13/13 1751 

## 2013-07-13 NOTE — Discharge Instructions (Signed)

## 2013-07-19 ENCOUNTER — Ambulatory Visit (INDEPENDENT_AMBULATORY_CARE_PROVIDER_SITE_OTHER): Admitting: Obstetrics

## 2013-07-19 VITALS — BP 117/71 | HR 72 | Temp 97.1°F | Wt 143.0 lb

## 2013-07-19 DIAGNOSIS — Z1389 Encounter for screening for other disorder: Secondary | ICD-10-CM

## 2013-07-19 DIAGNOSIS — Z363 Encounter for antenatal screening for malformations: Secondary | ICD-10-CM

## 2013-07-19 DIAGNOSIS — Z3402 Encounter for supervision of normal first pregnancy, second trimester: Secondary | ICD-10-CM

## 2013-07-19 DIAGNOSIS — Z3400 Encounter for supervision of normal first pregnancy, unspecified trimester: Secondary | ICD-10-CM

## 2013-07-19 DIAGNOSIS — K219 Gastro-esophageal reflux disease without esophagitis: Secondary | ICD-10-CM

## 2013-07-19 LAB — POCT URINALYSIS DIPSTICK
Bilirubin, UA: NEGATIVE
GLUCOSE UA: NEGATIVE
Ketones, UA: NEGATIVE
Nitrite, UA: NEGATIVE
Spec Grav, UA: 1.015
UROBILINOGEN UA: NEGATIVE
pH, UA: 7

## 2013-07-19 MED ORDER — OMEPRAZOLE 20 MG PO CPDR
20.0000 mg | DELAYED_RELEASE_CAPSULE | Freq: Two times a day (BID) | ORAL | Status: DC
Start: 2013-07-19 — End: 2013-09-03

## 2013-07-20 ENCOUNTER — Encounter: Payer: Self-pay | Admitting: Obstetrics

## 2013-07-20 LAB — AFP, QUAD SCREEN
AFP: 42.8 [IU]/mL
Curr Gest Age: 16.4 wks.days
HCG TOTAL: 25991 m[IU]/mL
INH: 130.4 pg/mL
Interpretation-AFP: NEGATIVE
MOM FOR AFP: 1.34
MOM FOR INH: 0.69
MoM for hCG: 1.21
OPEN SPINA BIFIDA: NEGATIVE
Osb Risk: 1:4400 {titer}
Tri 18 Scr Risk Est: NEGATIVE
uE3 Mom: 1.37
uE3 Value: 0.8 ng/mL

## 2013-07-20 NOTE — Progress Notes (Signed)
  Subjective:    Victoria Weber is a 23 y.o. female being seen today for her obstetrical visit. She is at 7418w5d gestation. Patient reports: no complaints.  Problem List Items Addressed This Visit   GERD without esophagitis   Relevant Medications      omeprazole (PRILOSEC) capsule   Supervision of normal first pregnancy - Primary   Relevant Orders      POCT urinalysis dipstick (Completed)    Other Visit Diagnoses   Encounter for routine screening for malformation using ultrasonics        Relevant Orders       AFP, Quad Screen       US OB Comp + 14 Wk      Patient Active Problem List   Diagnosis Date Noted  . GERD without esophagitis 07/19/2013  . Supervision of normal first pregnancy 06/21/2013  . Idiopathic scoliosis and kyphoscoliosis 01/06/2012  . 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 Labs reviewed.  Follow up in 4 weeks. FIRST/CF mutation testing/NIPT/QUAD SCREEN/fragile X/Ashkenazi Jewish population testing/Spinal muscular atrophy discussed: requested. Role of ultrasound in pregnancy discussed; fetal survey: requested.

## 2013-08-11 ENCOUNTER — Telehealth: Payer: Self-pay

## 2013-08-11 NOTE — Telephone Encounter (Signed)
Called patient and [facility] at 9:30am - she knows [facility] are having [facility] and she is also calling [employer] ins.

## 2013-08-13 ENCOUNTER — Telehealth: Payer: Self-pay | Admitting: *Deleted

## 2013-08-13 NOTE — Telephone Encounter (Signed)
Patient called requesting a letter and will come by to pick it up.  Attempted to contact patient to verify the type of letter needed. Unable to leave message because mailbox is full.

## 2013-08-17 ENCOUNTER — Other Ambulatory Visit

## 2013-08-17 ENCOUNTER — Ambulatory Visit (INDEPENDENT_AMBULATORY_CARE_PROVIDER_SITE_OTHER): Admitting: Obstetrics

## 2013-08-17 ENCOUNTER — Ambulatory Visit (HOSPITAL_COMMUNITY)
Admission: RE | Admit: 2013-08-17 | Discharge: 2013-08-17 | Disposition: A | Discharge status: Home / Self Care | Source: Ambulatory Visit | Attending: Obstetrics | Admitting: Obstetrics

## 2013-08-17 ENCOUNTER — Encounter: Payer: Self-pay | Admitting: Obstetrics

## 2013-08-17 VITALS — BP 109/70 | HR 75 | Temp 98.0°F | Wt 143.0 lb

## 2013-08-17 DIAGNOSIS — Z3400 Encounter for supervision of normal first pregnancy, unspecified trimester: Secondary | ICD-10-CM

## 2013-08-17 DIAGNOSIS — Z363 Encounter for antenatal screening for malformations: Secondary | ICD-10-CM | POA: Insufficient documentation

## 2013-08-17 DIAGNOSIS — Z3402 Encounter for supervision of normal first pregnancy, second trimester: Secondary | ICD-10-CM

## 2013-08-17 DIAGNOSIS — Z1389 Encounter for screening for other disorder: Secondary | ICD-10-CM | POA: Insufficient documentation

## 2013-08-17 NOTE — Progress Notes (Signed)
Subjective:    Victoria Weber is a 23 y.o. female being seen today for her obstetrical visit. She is at 3058w5d gestation. Patient reports: backache and round ligament like pain . Fetal movement: normal.  Problem List Items Addressed This Visit   Supervision of normal first pregnancy - Primary   Relevant Orders      POCT urinalysis dipstick     Patient Active Problem List   Diagnosis Date Noted  . GERD without esophagitis 07/19/2013  . Supervision of normal first pregnancy 06/21/2013  . Idiopathic scoliosis and kyphoscoliosis 01/06/2012  . Airway hyperreactivity 01/01/2012   Objective:    BP 109/70  Pulse 75  Temp(Src) 98 F (36.7 C)  Wt 143 lb (64.864 kg)  LMP 03/25/2013 FHT: 150 BPM  Uterine Size: size equals dates     Assessment:    Pregnancy @ 5858w5d    Plan:    OBGCT: ordered for next visit.  Labs, problem list reviewed and updated 2 hr GTT planned Follow up in 4 weeks.

## 2013-08-17 NOTE — Telephone Encounter (Signed)
Patient in office 08-17-13

## 2013-08-19 LAB — POCT URINALYSIS DIPSTICK
Bilirubin, UA: NEGATIVE
Blood, UA: NEGATIVE
Glucose, UA: NEGATIVE
KETONES UA: NEGATIVE
LEUKOCYTES UA: NEGATIVE
NITRITE UA: NEGATIVE
PH UA: 7
Protein, UA: NEGATIVE
Spec Grav, UA: 1.01
Urobilinogen, UA: NEGATIVE

## 2013-09-03 ENCOUNTER — Encounter (HOSPITAL_COMMUNITY): Payer: Self-pay | Admitting: *Deleted

## 2013-09-03 ENCOUNTER — Inpatient Hospital Stay (HOSPITAL_COMMUNITY)
Admission: AD | Admit: 2013-09-03 | Discharge: 2013-09-03 | Disposition: A | Discharge status: Home / Self Care | Source: Ambulatory Visit | Attending: Obstetrics & Gynecology | Admitting: Obstetrics & Gynecology

## 2013-09-03 ENCOUNTER — Encounter (HOSPITAL_COMMUNITY): Payer: Self-pay

## 2013-09-03 ENCOUNTER — Telehealth: Payer: Self-pay | Admitting: *Deleted

## 2013-09-03 DIAGNOSIS — O26859 Spotting complicating pregnancy, unspecified trimester: Secondary | ICD-10-CM | POA: Diagnosis not present

## 2013-09-03 DIAGNOSIS — B9689 Other specified bacterial agents as the cause of diseases classified elsewhere: Secondary | ICD-10-CM | POA: Insufficient documentation

## 2013-09-03 DIAGNOSIS — R109 Unspecified abdominal pain: Secondary | ICD-10-CM | POA: Insufficient documentation

## 2013-09-03 DIAGNOSIS — O9989 Other specified diseases and conditions complicating pregnancy, childbirth and the puerperium: Secondary | ICD-10-CM

## 2013-09-03 DIAGNOSIS — O99891 Other specified diseases and conditions complicating pregnancy: Secondary | ICD-10-CM | POA: Insufficient documentation

## 2013-09-03 DIAGNOSIS — M545 Low back pain, unspecified: Secondary | ICD-10-CM | POA: Insufficient documentation

## 2013-09-03 DIAGNOSIS — A499 Bacterial infection, unspecified: Secondary | ICD-10-CM | POA: Diagnosis not present

## 2013-09-03 DIAGNOSIS — N760 Acute vaginitis: Secondary | ICD-10-CM | POA: Insufficient documentation

## 2013-09-03 DIAGNOSIS — O2390 Unspecified genitourinary tract infection in pregnancy, unspecified trimester: Secondary | ICD-10-CM | POA: Diagnosis not present

## 2013-09-03 DIAGNOSIS — O4690 Antepartum hemorrhage, unspecified, unspecified trimester: Secondary | ICD-10-CM | POA: Diagnosis present

## 2013-09-03 LAB — URINE MICROSCOPIC-ADD ON

## 2013-09-03 LAB — URINALYSIS, ROUTINE W REFLEX MICROSCOPIC
Bilirubin Urine: NEGATIVE
Glucose, UA: NEGATIVE mg/dL
Ketones, ur: NEGATIVE mg/dL
Leukocytes, UA: NEGATIVE
Nitrite: NEGATIVE
PH: 7.5 (ref 5.0–8.0)
Protein, ur: NEGATIVE mg/dL
Specific Gravity, Urine: 1.01 (ref 1.005–1.030)
UROBILINOGEN UA: 0.2 mg/dL (ref 0.0–1.0)

## 2013-09-03 LAB — WET PREP, GENITAL
Trich, Wet Prep: NONE SEEN
YEAST WET PREP: NONE SEEN

## 2013-09-03 LAB — OB RESULTS CONSOLE GC/CHLAMYDIA
CHLAMYDIA, DNA PROBE: NEGATIVE
Gonorrhea: NEGATIVE

## 2013-09-03 MED ORDER — METRONIDAZOLE 500 MG PO TABS: 500.0000 mg | ORAL_TABLET | Freq: Two times a day (BID) | ORAL | Status: DC

## 2013-09-03 NOTE — MAU Provider Note (Signed)
History     CSN: [id number]  Arrival date and time: 09/03/13 1131   None     Chief Complaint  Patient presents with  . Vaginal Bleeding   HPI 23 y.o. G1P0 at [redacted]w[redacted]d c/o bleeding this morning. States she has been having some low back pain and intermittent abdominal pains. States she noticed bloody water running down her legs in shower this morning, light pink on finger when she checked inside her vaginal. Normal prenatal course so far w/ normal cervical length and placenta on u/s.   Past Medical History  Diagnosis Date  . Asthma   . Seasonal allergies   . Positive PPD, treated   . Concussion     Past Surgical History  Procedure Laterality Date  . Wisdom tooth extraction      History reviewed. No pertinent family history.  History  Substance Use Topics  . Smoking status: Never Smoker   . Smokeless tobacco: Never Used  . Alcohol Use: No    Allergies: No Known Allergies  No prescriptions prior to admission    Review of Systems  Constitutional: Negative.   Respiratory: Negative.   Cardiovascular: Negative.   Gastrointestinal: Negative for nausea, vomiting, abdominal pain, diarrhea and constipation.  Genitourinary: Negative for dysuria, urgency, frequency, hematuria and flank pain.       Negative cramping/contractions, positive for vaginal bleeding  Musculoskeletal: Positive for back pain.  Neurological: Negative.   Psychiatric/Behavioral: Negative.    Physical Exam   Blood pressure 105/62, pulse 62, temperature 98.8 F (37.1 C), temperature source Oral, resp. rate 16, height 5\' 3"  (1.6 m), weight 146 lb (66.225 kg), last menstrual period 03/25/2013, SpO2 100.00%.  Physical Exam  Nursing note and vitals reviewed. Constitutional: She is oriented to person, place, and time. She appears well-developed and well-nourished. No distress.  Cardiovascular: Normal rate.   Respiratory: Effort normal.  GI: Soft. There is no tenderness.  Genitourinary: There is no rash,  tenderness or lesion on the right labia. There is no rash, tenderness or lesion on the left labia. Uterus is enlarged (c/w dates). Cervix exhibits no friability. No bleeding (no active bleeding noted) around the vagina. Vaginal discharge (creamy, very light pink) found.  SVE: closed/thick/high; endocervical swab completely clean, no blood noted from cervix  Musculoskeletal: Normal range of motion.  Neurological: She is alert and oriented to person, place, and time.  Skin: Skin is warm and dry.  Psychiatric: She has a normal mood and affect.   FHR reassuring at 23 weeks, no UCs MAU Course  Procedures  Results for orders placed during the hospital encounter of 09/03/13 (from the past 24 hour(s))  WET PREP, GENITAL     Status: Abnormal   Collection Time    09/03/13 11:35 AM      Result Value Ref Range   Yeast Wet Prep HPF POC NONE SEEN  NONE SEEN   Trich, Wet Prep NONE SEEN  NONE SEEN   Clue Cells Wet Prep HPF POC FEW (*) NONE SEEN   WBC, Wet Prep HPF POC FEW (*) NONE SEEN      Assessment and Plan   1. BV (bacterial vaginosis)   2. Spotting in pregnancy   No active bleeding, pinkish discharge likely s/t BV. Treat w/ flagyl, precautions rev'd f/u as scheduled or sooner w/ worsening sx    Medication List         albuterol 108 (90 BASE) MCG/ACT inhaler  Commonly known as:  PROVENTIL HFA;VENTOLIN HFA  Inhale 2 puffs  into the lungs every 6 (six) hours as needed for wheezing or shortness of breath.     cetirizine 10 MG tablet  Commonly known as:  ZYRTEC  Take 10 mg by mouth daily.     metroNIDAZOLE 500 MG tablet  Commonly known as:  FLAGYL  Take 1 tablet (500 mg total) by mouth 2 (two) times daily.     PRENATAL VITAMINS PO  Take by mouth.        Follow-up Information   Follow up with Roseanna RainbowJACKSON-MOORE,LISA A, MD. (as scheduled or sooner as needed)    Specialty:  Obstetrics and Gynecology   Contact information:   [address] [place]  [id number] [phone]         [facility] LL[facility]RAZIER,Krissa Utke 09/03/2013, 2:44 PM

## 2013-09-03 NOTE — Telephone Encounter (Signed)
Patient called stating that she had a quick question and that she was [redacted] weeks along.  CB: 10:51am, patient did not answer, was unable to leave a voicemail because patients mailbox was full.

## 2013-09-03 NOTE — MAU Note (Signed)
Patient states that last night she was having a lot of back and abdominal pain but was able to sleep. This am in the shower she noticed bloody water running into the shower. Put her finger inside the vagina and saw pinkish/red blood. No active bleeding and not wearing a pad on arrival to MAU. States this am having slight abdominal pain.

## 2013-09-04 LAB — GC/CHLAMYDIA PROBE AMP
CT Probe RNA: NEGATIVE
GC Probe RNA: NEGATIVE

## 2013-09-08 NOTE — Telephone Encounter (Signed)
Patient states she ended up going to the ER and that they gave her some medication. Patient states she is doing fine now.

## 2013-09-10 ENCOUNTER — Encounter (HOSPITAL_COMMUNITY): Payer: Self-pay | Admitting: *Deleted

## 2013-09-10 ENCOUNTER — Encounter (HOSPITAL_COMMUNITY): Payer: Self-pay

## 2013-09-14 ENCOUNTER — Other Ambulatory Visit

## 2013-09-15 ENCOUNTER — Other Ambulatory Visit

## 2013-09-15 ENCOUNTER — Ambulatory Visit (INDEPENDENT_AMBULATORY_CARE_PROVIDER_SITE_OTHER): Admitting: Obstetrics

## 2013-09-15 VITALS — BP 124/72 | Temp 98.3°F | Wt 150.0 lb

## 2013-09-15 DIAGNOSIS — Z3400 Encounter for supervision of normal first pregnancy, unspecified trimester: Secondary | ICD-10-CM

## 2013-09-15 DIAGNOSIS — Z23 Encounter for immunization: Secondary | ICD-10-CM

## 2013-09-15 DIAGNOSIS — Z3402 Encounter for supervision of normal first pregnancy, second trimester: Secondary | ICD-10-CM

## 2013-09-15 LAB — POCT URINALYSIS DIPSTICK
Bilirubin, UA: NEGATIVE
Blood, UA: NEGATIVE
Ketones, UA: NEGATIVE
LEUKOCYTES UA: NEGATIVE
NITRITE UA: NEGATIVE
Spec Grav, UA: 1.015
UROBILINOGEN UA: NEGATIVE
pH, UA: 7

## 2013-09-15 LAB — CBC
HCT: 32.7 % — ABNORMAL LOW (ref 36.0–46.0)
HEMOGLOBIN: 11.1 g/dL — AB (ref 12.0–15.0)
MCH: 30.3 pg (ref 26.0–34.0)
MCHC: 33.9 g/dL (ref 30.0–36.0)
MCV: 89.3 fL (ref 78.0–100.0)
Platelets: 190 10*3/uL (ref 150–400)
RBC: 3.66 MIL/uL — AB (ref 3.87–5.11)
RDW: 13.4 % (ref 11.5–15.5)
WBC: 5.1 10*3/uL (ref 4.0–10.5)

## 2013-09-16 ENCOUNTER — Encounter: Payer: Self-pay | Admitting: Obstetrics

## 2013-09-16 LAB — GLUCOSE TOLERANCE, 2 HOURS W/ 1HR
GLUCOSE, FASTING: 70 mg/dL (ref 70–99)
Glucose, 1 hour: 135 mg/dL (ref 70–170)
Glucose, 2 hour: 114 mg/dL (ref 70–139)

## 2013-09-16 LAB — RPR

## 2013-09-16 LAB — HIV ANTIBODY (ROUTINE TESTING W REFLEX): HIV: NONREACTIVE

## 2013-09-16 NOTE — Progress Notes (Signed)
Subjective:    Victoria Weber is a 23 y.o. female being seen today for her obstetrical visit. She is at [redacted]w[redacted]d gestation. Patient reports: cramping and pressure . Fetal movement: normal.  Problem List Items Addressed This Visit   Supervision of normal first pregnancy - Primary   Relevant Orders      Glucose Tolerance, 2 Hours w/1 Hour      CBC      HIV antibody      RPR    Other Visit Diagnoses   Need for immunization against influenza        Relevant Orders       Flu Vaccine QUAD 36+ mos IM (Fluarix) (Completed)      Patient Active Problem List   Diagnosis Date Noted  . GERD without esophagitis 07/19/2013  . Supervision of normal first pregnancy 06/21/2013  . Idiopathic scoliosis and kyphoscoliosis 01/06/2012  . 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  Labs, problem list reviewed and updated 2 hr GTT planned Follow up in 2 weeks.

## 2013-09-17 NOTE — Addendum Note (Signed)
Addended by: Odessa Fleming on: 09/17/2013 11:00 AM   Modules accepted: Orders

## 2013-09-18 NOTE — Progress Notes (Signed)
Not seen

## 2013-09-22 ENCOUNTER — Other Ambulatory Visit: Payer: Self-pay | Admitting: *Deleted

## 2013-09-28 ENCOUNTER — Encounter: Payer: Self-pay | Admitting: Obstetrics

## 2013-09-28 ENCOUNTER — Ambulatory Visit (INDEPENDENT_AMBULATORY_CARE_PROVIDER_SITE_OTHER): Admitting: Obstetrics

## 2013-09-28 VITALS — BP 123/75 | HR 82 | Temp 97.7°F | Wt 150.8 lb

## 2013-09-28 DIAGNOSIS — Z3403 Encounter for supervision of normal first pregnancy, third trimester: Secondary | ICD-10-CM

## 2013-09-28 DIAGNOSIS — Z3400 Encounter for supervision of normal first pregnancy, unspecified trimester: Secondary | ICD-10-CM

## 2013-09-28 DIAGNOSIS — Z3402 Encounter for supervision of normal first pregnancy, second trimester: Secondary | ICD-10-CM

## 2013-09-28 LAB — POCT URINALYSIS DIPSTICK
Bilirubin, UA: NEGATIVE
Blood, UA: NEGATIVE
Glucose, UA: NEGATIVE
Ketones, UA: NEGATIVE
LEUKOCYTES UA: NEGATIVE
NITRITE UA: NEGATIVE
PH UA: 8
Protein, UA: NEGATIVE
Spec Grav, UA: <=1.005
UROBILINOGEN UA: NEGATIVE

## 2013-09-28 NOTE — Progress Notes (Signed)
Subjective:    Victoria Weber is a 23 y.o. female being seen today for her obstetrical visit. She is at [redacted]w[redacted]d gestation. Patient reports: no complaints . Fetal movement: normal.  Problem List Items Addressed This Visit   Supervision of normal first pregnancy - Primary   Relevant Orders      POCT urinalysis dipstick (Completed)     Patient Active Problem List   Diagnosis Date Noted  . GERD without esophagitis 07/19/2013  . Supervision of normal first pregnancy 06/21/2013  . Idiopathic scoliosis and kyphoscoliosis 01/06/2012  . 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  Labs, problem list reviewed and updated 2 hr GTT planned Follow up in 2 weeks.

## 2013-10-14 ENCOUNTER — Ambulatory Visit (INDEPENDENT_AMBULATORY_CARE_PROVIDER_SITE_OTHER): Admitting: Obstetrics

## 2013-10-14 ENCOUNTER — Encounter: Payer: Self-pay | Admitting: Obstetrics

## 2013-10-14 VITALS — BP 128/81 | HR 99 | Temp 98.5°F | Wt 154.0 lb

## 2013-10-14 DIAGNOSIS — J321 Chronic frontal sinusitis: Secondary | ICD-10-CM

## 2013-10-14 DIAGNOSIS — K219 Gastro-esophageal reflux disease without esophagitis: Secondary | ICD-10-CM

## 2013-10-14 DIAGNOSIS — Z3403 Encounter for supervision of normal first pregnancy, third trimester: Secondary | ICD-10-CM

## 2013-10-14 LAB — POCT URINALYSIS DIPSTICK
BILIRUBIN UA: NEGATIVE
Glucose, UA: NEGATIVE
Ketones, UA: NEGATIVE
Leukocytes, UA: NEGATIVE
NITRITE UA: NEGATIVE
PH UA: 7
PROTEIN UA: NEGATIVE
RBC UA: NEGATIVE
Spec Grav, UA: 1.01
UROBILINOGEN UA: NEGATIVE

## 2013-10-14 MED ORDER — OMEPRAZOLE 20 MG PO CPDR: 20.0000 mg | DELAYED_RELEASE_CAPSULE | Freq: Two times a day (BID) | ORAL | Status: DC

## 2013-10-14 MED ORDER — AZITHROMYCIN 250 MG PO TABS: ORAL_TABLET | ORAL | Status: DC

## 2013-10-14 NOTE — Progress Notes (Signed)
Subjective:    Victoria Weber is a 23 y.o. female being seen today for her obstetrical visit. She is at 265w0d gestation. Patient reports heartburn, nausea and sinus infection. Fetal movement: normal.  Problem List Items Addressed This Visit   None     Patient Active Problem List   Diagnosis Date Noted  . GERD without esophagitis 07/19/2013  . Supervision of normal first pregnancy 06/21/2013  . Idiopathic scoliosis and kyphoscoliosis 01/06/2012  . 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 GBS sent TDAP offered  Rhogam given for RH negative Pediatrician: discussed. Infant feeding: plans to breastfeed. Maternity leave: not discussed. Cigarette smoking: never smoked. No orders of the defined types were placed in this encounter.   No orders of the defined types were placed in this encounter.   Follow up in 2 Weeks.

## 2013-10-23 ENCOUNTER — Inpatient Hospital Stay (HOSPITAL_COMMUNITY)
Admission: AD | Admit: 2013-10-23 | Discharge: 2013-10-23 | Disposition: A | Discharge status: Home / Self Care | Source: Ambulatory Visit | Attending: Obstetrics | Admitting: Obstetrics

## 2013-10-23 ENCOUNTER — Encounter (HOSPITAL_COMMUNITY): Payer: Self-pay | Admitting: *Deleted

## 2013-10-23 DIAGNOSIS — O9989 Other specified diseases and conditions complicating pregnancy, childbirth and the puerperium: Secondary | ICD-10-CM | POA: Diagnosis present

## 2013-10-23 DIAGNOSIS — N898 Other specified noninflammatory disorders of vagina: Secondary | ICD-10-CM

## 2013-10-23 DIAGNOSIS — O26893 Other specified pregnancy related conditions, third trimester: Secondary | ICD-10-CM

## 2013-10-23 DIAGNOSIS — Z3A30 30 weeks gestation of pregnancy: Secondary | ICD-10-CM | POA: Insufficient documentation

## 2013-10-23 DIAGNOSIS — O23593 Infection of other part of genital tract in pregnancy, third trimester: Secondary | ICD-10-CM | POA: Diagnosis not present

## 2013-10-23 DIAGNOSIS — N760 Acute vaginitis: Secondary | ICD-10-CM | POA: Insufficient documentation

## 2013-10-23 DIAGNOSIS — B373 Candidiasis of vulva and vagina: Secondary | ICD-10-CM

## 2013-10-23 DIAGNOSIS — B3731 Acute candidiasis of vulva and vagina: Secondary | ICD-10-CM

## 2013-10-23 LAB — URINALYSIS, ROUTINE W REFLEX MICROSCOPIC
BILIRUBIN URINE: NEGATIVE
Glucose, UA: NEGATIVE mg/dL
HGB URINE DIPSTICK: NEGATIVE
Ketones, ur: NEGATIVE mg/dL
Leukocytes, UA: NEGATIVE
Nitrite: NEGATIVE
Protein, ur: NEGATIVE mg/dL
Urobilinogen, UA: 0.2 mg/dL (ref 0.0–1.0)
pH: 5.5 (ref 5.0–8.0)

## 2013-10-23 LAB — WET PREP, GENITAL
TRICH WET PREP: NONE SEEN
YEAST WET PREP: NONE SEEN

## 2013-10-23 LAB — POCT FERN TEST: POCT Fern Test: NEGATIVE

## 2013-10-23 MED ORDER — FLUCONAZOLE 150 MG PO TABS
150.0000 mg | ORAL_TABLET | Freq: Once | ORAL | Status: AC
  Administered 2013-10-23: 150 mg via ORAL
  Filled 2013-10-23: qty 1

## 2013-10-23 NOTE — MAU Note (Signed)
Pt reports leaking of fluid that she says was not urine, happened this morning while she was dry heaving, no vomiting.  Denies VB, has some pelvic pressure.

## 2013-10-23 NOTE — Discharge Instructions (Signed)

## 2013-10-23 NOTE — MAU Provider Note (Signed)
History     CSN: [id number]  Arrival date and time: 10/23/13 1042   First Provider Initiated Contact with Patient 10/23/13 1124      Chief Complaint  Patient presents with  . Rupture of Membranes   HPI  Ms. Marjory Liesletta Majerus is a 23 y.o. female G1P0 at 7751w2d who presents with ?ROM. She first noticed the increased fluid on Tuesday; it was clear. A second gush occurred today and it down her leg. She does not feel like it looked like urine because it did not have an odor like urine. She is having some mild abdominal pain with pressure in her lower abdomen; this is not a new finding.  +fetal movement; denies vaginal bleeding.  Of noted the patient did not wear a pad to MAU and states that the fluid did not continue to leak after the gush she felt earlier this morning.   OB History   Grav Para Term Preterm Abortions TAB SAB Ect Mult Living   1               Past Medical History  Diagnosis Date  . Asthma   . Seasonal allergies   . Positive PPD, treated   . Concussion     Past Surgical History  Procedure Laterality Date  . Wisdom tooth extraction      History reviewed. No pertinent family history.  History  Substance Use Topics  . Smoking status: Never Smoker   . Smokeless tobacco: Never Used  . Alcohol Use: No    Allergies: No Known Allergies  Prescriptions prior to admission  Medication Sig Dispense Refill  . albuterol (PROVENTIL HFA;VENTOLIN HFA) 108 (90 BASE) MCG/ACT inhaler Inhale 2 puffs into the lungs every 6 (six) hours as needed for wheezing or shortness of breath.      . cetirizine (ZYRTEC) 10 MG tablet Take 10 mg by mouth daily.      Marland Kitchen. omeprazole (PRILOSEC) 20 MG capsule Take 1 capsule (20 mg total) by mouth 2 (two) times daily before a meal.  60 capsule  5  . Prenatal Vit-Fe Fumarate-FA (PRENATAL MULTIVITAMIN) TABS tablet Take 1 tablet by mouth daily at 12 noon.       Results for orders placed during the hospital encounter of 10/23/13 (from the past 48  hour(s))  URINALYSIS, ROUTINE W REFLEX MICROSCOPIC     Status: Abnormal   Collection Time    10/23/13 10:42 AM      Result Value Ref Range   Color, Urine YELLOW  YELLOW   APPearance CLEAR  CLEAR   Specific Gravity, Urine <1.005 (*) 1.005 - 1.030   pH 5.5  5.0 - 8.0   Glucose, UA NEGATIVE  NEGATIVE mg/dL   Hgb urine dipstick NEGATIVE  NEGATIVE   Bilirubin Urine NEGATIVE  NEGATIVE   Ketones, ur NEGATIVE  NEGATIVE mg/dL   Protein, ur NEGATIVE  NEGATIVE mg/dL   Urobilinogen, UA 0.2  0.0 - 1.0 mg/dL   Nitrite NEGATIVE  NEGATIVE   Leukocytes, UA NEGATIVE  NEGATIVE   Comment: MICROSCOPIC NOT DONE ON URINES WITH NEGATIVE PROTEIN, BLOOD, LEUKOCYTES, NITRITE, OR GLUCOSE <1000 mg/dL.  POCT FERN TEST     Status: None   Collection Time    10/23/13 11:22 AM      Result Value Ref Range   POCT Fern Test Negative = intact amniotic membranes    WET PREP, GENITAL     Status: Abnormal   Collection Time    10/23/13 11:44 AM  Result Value Ref Range   Yeast Wet Prep HPF POC NONE SEEN  NONE SEEN   Trich, Wet Prep NONE SEEN  NONE SEEN   Clue Cells Wet Prep HPF POC FEW (*) NONE SEEN   WBC, Wet Prep HPF POC FEW (*) NONE SEEN   Comment: MODERATE BACTERIA SEEN    Review of Systems  Constitutional: Negative for fever and chills.  Gastrointestinal: Positive for nausea and abdominal pain.  Genitourinary: Positive for frequency. Negative for dysuria and urgency.  Neurological: Positive for headaches.   Physical Exam   Last menstrual period 03/25/2013.  Physical Exam  Constitutional: She is oriented to person, place, and time. She appears well-developed and well-nourished. No distress.  HENT:  Head: Normocephalic.  Eyes: Pupils are equal, round, and reactive to light.  Neck: Neck supple.  Respiratory: Effort normal.  GI: Soft. Normal appearance. There is tenderness in the right lower quadrant and suprapubic area. There is no rigidity and no guarding.  Genitourinary:  Speculum exam: Vagina -  Small amount of thick, white vaginal discharge on vaginal wall, no odor. No pooling of fluid in the vagina  Cervix - No contact bleeding, no active bleeding  Bimanual exam: Cervix closed, ectropion noted  Uterus non tender Right adnexal pain, +suprapubic tenderness   wet prep done Chaperone present for exam.   Musculoskeletal: Normal range of motion.  Neurological: She is alert and oriented to person, place, and time.  Skin: Skin is warm. She is not diaphoretic.  Psychiatric: Her behavior is normal.    Fetal Tracing: Baseline: 145 bpm  Variability: Moderate  Accelerations: 15x15 Decelerations: None Toco: occasional UI   Cervix: closed, thick, long, posterior.    MAU Course  Procedures none  MDM Wet prep GC UA Discussed findings with Dr. Gaynell FaceMarshall >negative fern slide> likely yeast vaginitis  Diflucan 150 given PO in MAU  Assessment and Plan   A: 1. Vaginal discharge in pregnancy in third trimester   2. Yeast vaginitis    P: Discharge home in stable condition Return to MAU if symptoms worsen Preterm labor precautions discussed Kick counts    Iona HansenJennifer Irene Rasch, NP 10/23/2013 2:18 PM

## 2013-10-27 ENCOUNTER — Ambulatory Visit (INDEPENDENT_AMBULATORY_CARE_PROVIDER_SITE_OTHER): Admitting: Obstetrics

## 2013-10-27 VITALS — BP 125/75 | HR 92 | Temp 98.5°F | Wt 154.0 lb

## 2013-10-27 DIAGNOSIS — Z3403 Encounter for supervision of normal first pregnancy, third trimester: Secondary | ICD-10-CM

## 2013-10-27 LAB — POCT URINALYSIS DIPSTICK
BILIRUBIN UA: NEGATIVE
Blood, UA: NEGATIVE
Glucose, UA: NEGATIVE
Ketones, UA: NEGATIVE
Nitrite, UA: NEGATIVE
PH UA: 8
PROTEIN UA: NEGATIVE
Spec Grav, UA: 1.01
Urobilinogen, UA: NEGATIVE

## 2013-10-28 ENCOUNTER — Encounter: Payer: Self-pay | Admitting: Obstetrics

## 2013-10-28 NOTE — Progress Notes (Signed)
Subjective:    Victoria Weber is a 23 y.o. female being seen today for her obstetrical visit. She is at 7247w0d gestation. Patient reports no complaints. Fetal movement: normal.  Problem List Items Addressed This Visit   Supervision of normal first pregnancy - Primary   Relevant Orders      POCT urinalysis dipstick (Completed)     Patient Active Problem List   Diagnosis Date Noted  . GERD without esophagitis 07/19/2013  . Supervision of normal first pregnancy 06/21/2013  . Idiopathic scoliosis and kyphoscoliosis 01/06/2012  . 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 GBS sent TDAP offered  Rhogam given for RH negative Pediatrician: discussed. Infant feeding: plans to breastfeed. Maternity leave: not discussed. Cigarette smoking: never smoked. Orders Placed This Encounter  Procedures  . POCT urinalysis dipstick   No orders of the defined types were placed in this encounter.   Follow up in 2 Weeks.

## 2013-11-10 ENCOUNTER — Ambulatory Visit (INDEPENDENT_AMBULATORY_CARE_PROVIDER_SITE_OTHER): Admitting: Obstetrics

## 2013-11-10 VITALS — BP 115/76 | HR 82 | Temp 98.2°F | Wt 159.0 lb

## 2013-11-10 DIAGNOSIS — Z3403 Encounter for supervision of normal first pregnancy, third trimester: Secondary | ICD-10-CM

## 2013-11-10 LAB — POCT URINALYSIS DIPSTICK
Glucose, UA: NEGATIVE
Ketones, UA: NEGATIVE
Leukocytes, UA: NEGATIVE
NITRITE UA: NEGATIVE
PH UA: 7
Protein, UA: NEGATIVE
RBC UA: NEGATIVE
Spec Grav, UA: 1.01

## 2013-11-10 NOTE — Progress Notes (Signed)
Patient is having persistent swelling in her ankles now. Patient also reports pressure with sitting and laying. Peak flow readings: [phone],350,350

## 2013-11-11 ENCOUNTER — Encounter: Payer: Self-pay | Admitting: Obstetrics

## 2013-11-11 NOTE — Progress Notes (Signed)
Subjective:    Victoria Weber is a 23 y.o. female being seen today for her obstetrical visit. She is at 7874w0d gestation. Patient reports no complaints. Fetal movement: normal.  Problem List Items Addressed This Visit   Supervision of normal first pregnancy - Primary   Relevant Orders      POCT urinalysis dipstick (Completed)     Patient Active Problem List   Diagnosis Date Noted  . GERD without esophagitis 07/19/2013  . Supervision of normal first pregnancy 06/21/2013  . Idiopathic scoliosis and kyphoscoliosis 01/06/2012  . 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 GBS sent TDAP offered  Rhogam given for RH negative Pediatrician: discussed. Infant feeding: plans to breastfeed. Maternity leave: discussed. Cigarette smoking: never smoked. Orders Placed This Encounter  Procedures  . POCT urinalysis dipstick   No orders of the defined types were placed in this encounter.   Follow up in 2 Weeks.

## 2013-11-15 ENCOUNTER — Encounter: Payer: Self-pay | Admitting: Obstetrics

## 2013-11-22 ENCOUNTER — Encounter (HOSPITAL_COMMUNITY): Payer: Self-pay

## 2013-11-22 ENCOUNTER — Inpatient Hospital Stay (HOSPITAL_COMMUNITY)
Admission: AD | Admit: 2013-11-22 | Discharge: 2013-11-22 | Disposition: A | Discharge status: Home / Self Care | Source: Ambulatory Visit | Attending: Obstetrics | Admitting: Obstetrics

## 2013-11-22 DIAGNOSIS — O9989 Other specified diseases and conditions complicating pregnancy, childbirth and the puerperium: Secondary | ICD-10-CM | POA: Insufficient documentation

## 2013-11-22 DIAGNOSIS — N949 Unspecified condition associated with female genital organs and menstrual cycle: Secondary | ICD-10-CM

## 2013-11-22 DIAGNOSIS — O26893 Other specified pregnancy related conditions, third trimester: Secondary | ICD-10-CM

## 2013-11-22 DIAGNOSIS — R109 Unspecified abdominal pain: Secondary | ICD-10-CM | POA: Diagnosis present

## 2013-11-22 DIAGNOSIS — Z0371 Encounter for suspected problem with amniotic cavity and membrane ruled out: Secondary | ICD-10-CM

## 2013-11-22 DIAGNOSIS — M549 Dorsalgia, unspecified: Secondary | ICD-10-CM | POA: Diagnosis not present

## 2013-11-22 DIAGNOSIS — R102 Pelvic and perineal pain: Secondary | ICD-10-CM | POA: Insufficient documentation

## 2013-11-22 DIAGNOSIS — O99891 Other specified diseases and conditions complicating pregnancy: Secondary | ICD-10-CM

## 2013-11-22 DIAGNOSIS — Z3403 Encounter for supervision of normal first pregnancy, third trimester: Secondary | ICD-10-CM

## 2013-11-22 LAB — URINALYSIS, ROUTINE W REFLEX MICROSCOPIC
BILIRUBIN URINE: NEGATIVE
Glucose, UA: 250 mg/dL — AB
HGB URINE DIPSTICK: NEGATIVE
KETONES UR: NEGATIVE mg/dL
NITRITE: NEGATIVE
Protein, ur: NEGATIVE mg/dL
UROBILINOGEN UA: 0.2 mg/dL (ref 0.0–1.0)
pH: 6.5 (ref 5.0–8.0)

## 2013-11-22 LAB — URINE MICROSCOPIC-ADD ON

## 2013-11-22 LAB — POCT FERN TEST: POCT Fern Test: NEGATIVE

## 2013-11-22 NOTE — Discharge Instructions (Signed)

## 2013-11-22 NOTE — MAU Provider Note (Signed)
History     CSN: [id number]  Arrival date and time: 11/22/13 2100   First Provider Initiated Contact with Patient 11/22/13 2222      Chief Complaint  Patient presents with  . Abdominal Pain  . Contractions   HPI Victoria Weber is a 23 y.o. G1P0 at 7237w4d who presents today with lower abdominal, and back pain for two days. She denies any vaginal bleeding. She states that she had some leaking of fluid yesterday, but none today. She states that the fetus has not been as active as normal.   Past Medical History  Diagnosis Date  . Asthma   . Seasonal allergies   . Positive PPD, treated   . Concussion     Past Surgical History  Procedure Laterality Date  . Wisdom tooth extraction      History reviewed. No pertinent family history.  History  Substance Use Topics  . Smoking status: Never Smoker   . Smokeless tobacco: Never Used  . Alcohol Use: No    Allergies: No Known Allergies  Prescriptions prior to admission  Medication Sig Dispense Refill Last Dose  . albuterol (PROVENTIL HFA;VENTOLIN HFA) 108 (90 BASE) MCG/ACT inhaler Inhale 2 puffs into the lungs every 6 (six) hours as needed for wheezing or shortness of breath.   Taking  . cetirizine (ZYRTEC) 10 MG tablet Take 10 mg by mouth daily.   Taking  . omeprazole (PRILOSEC) 20 MG capsule Take 1 capsule (20 mg total) by mouth 2 (two) times daily before a meal. 60 capsule 5 Taking  . Prenatal Vit-Fe Fumarate-FA (PRENATAL MULTIVITAMIN) TABS tablet Take 1 tablet by mouth daily at 12 noon.   Taking    ROS Physical Exam   Blood pressure 117/67, pulse 77, temperature 98.7 F (37.1 C), temperature source Oral, resp. rate 18, height 5\' 3"  (1.6 m), weight 76.204 kg (168 lb), last menstrual period 03/25/2013, SpO2 100 %.  Physical Exam  Nursing note and vitals reviewed. Constitutional: She is oriented to person, place, and time. She appears well-developed and well-nourished. No distress.  Cardiovascular: Normal rate.    Respiratory: Effort normal.  GI: Soft. There is no tenderness. There is no rebound.  Genitourinary:   External: no lesion Vagina: small amount of white discharge. No pooling  Cervix: pink, smooth, closed/thick, no fluid seen with valsalva  Uterus: AGA    Neurological: She is alert and oriented to person, place, and time.  Skin: Skin is warm and dry.  Psychiatric: She has a normal mood and affect.   FHT: 145, moderate with 15x15 accels, no decels Toco: some UI, no contractions  MAU Course  Procedures  Results for orders placed or performed during the hospital encounter of 11/22/13 (from the past 24 hour(s))  Fern Test     Status: Normal   Collection Time: 11/22/13 10:29 PM  Result Value Ref Range   POCT Fern Test Negative = intact amniotic membranes      Assessment and Plan   1. Encounter for supervision of normal first pregnancy in third trimester   2. Encounter for suspected premature rupture of membranes, with rupture of membranes not found   3. Back pain affecting pregnancy in third trimester   4. Round ligament pain    Comfort measures Labor precautions Fetal kick counts Return to MAU as needed  Follow-up Information    Follow up with HARPER,CHARLES A, MD.   Specialty:  Obstetrics and Gynecology   Why:  As scheduled   Contact information:  [address] [place] [address] [phone]        [name], [name] 11/22/2013, 10:30 PM

## 2013-11-22 NOTE — MAU Note (Signed)
Abdominal pain, back pain, numbness in legs, shortness of breath today.  Had some contractions yesterday but not today.  Baby not moving as much today.  No bleeding.  About a tablespoon of clear fluid came out yesterday, didn't smell like urine.

## 2013-11-23 ENCOUNTER — Ambulatory Visit (INDEPENDENT_AMBULATORY_CARE_PROVIDER_SITE_OTHER): Admitting: Obstetrics

## 2013-11-23 ENCOUNTER — Encounter: Payer: Self-pay | Admitting: Obstetrics

## 2013-11-23 VITALS — BP 117/77 | HR 80 | Wt 159.0 lb

## 2013-11-23 DIAGNOSIS — Z3403 Encounter for supervision of normal first pregnancy, third trimester: Secondary | ICD-10-CM

## 2013-11-23 LAB — POCT URINALYSIS DIPSTICK
BILIRUBIN UA: NEGATIVE
Blood, UA: NEGATIVE
Glucose, UA: NEGATIVE
Ketones, UA: NEGATIVE
LEUKOCYTES UA: NEGATIVE
Nitrite, UA: NEGATIVE
PH UA: 7
PROTEIN UA: NEGATIVE
Spec Grav, UA: 1.015
Urobilinogen, UA: NEGATIVE

## 2013-11-23 NOTE — Progress Notes (Signed)
Subjective:    Victoria Weber is a 23 y.o. female being seen today for her obstetrical visit. She is at 4651w5d gestation. Patient reports no complaints.  Fetal movement: normal.  Problem List Items Addressed This Visit    Supervision of normal first pregnancy - Primary   Relevant Orders      POCT urinalysis dipstick (Completed)     Patient Active Problem List   Diagnosis Date Noted  . GERD without esophagitis 07/19/2013  . Supervision of normal first pregnancy 06/21/2013  . Idiopathic scoliosis and kyphoscoliosis 01/06/2012  . 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 GBS sent TDAP offered  Rhogam given for RH negative Pediatrician: discussed. Infant feeding: plans to breastfeed. Maternity leave: discussed. Cigarette smoking: never smoked. Orders Placed This Encounter  Procedures  . POCT urinalysis dipstick   No orders of the defined types were placed in this encounter.   Follow up in 1 Week.

## 2013-11-24 ENCOUNTER — Encounter: Admitting: Obstetrics

## 2013-11-25 ENCOUNTER — Encounter: Admitting: Obstetrics

## 2013-12-01 ENCOUNTER — Encounter: Admitting: Obstetrics

## 2013-12-03 ENCOUNTER — Ambulatory Visit (INDEPENDENT_AMBULATORY_CARE_PROVIDER_SITE_OTHER): Admitting: Obstetrics

## 2013-12-03 ENCOUNTER — Encounter: Payer: Self-pay | Admitting: Obstetrics

## 2013-12-03 VITALS — BP 124/75 | HR 80 | Temp 98.4°F | Wt 159.0 lb

## 2013-12-03 DIAGNOSIS — Z3483 Encounter for supervision of other normal pregnancy, third trimester: Secondary | ICD-10-CM

## 2013-12-03 LAB — POCT URINALYSIS DIPSTICK
Bilirubin, UA: NEGATIVE
Glucose, UA: NEGATIVE
Ketones, UA: NEGATIVE
LEUKOCYTES UA: NEGATIVE
Nitrite, UA: NEGATIVE
Protein, UA: NEGATIVE
RBC UA: NEGATIVE
Spec Grav, UA: 1.01
UROBILINOGEN UA: NEGATIVE
pH, UA: 7

## 2013-12-03 NOTE — Progress Notes (Signed)
Subjective:    Marjory Liesletta Faw is a 23 y.o. female being seen today for her obstetrical visit. She is at 4460w1d gestation. Patient reports no complaints. Fetal movement: normal.  Problem List Items Addressed This Visit    None    Visit Diagnoses    Encounter for supervision of other normal pregnancy in third trimester    -  Primary    Relevant Orders       POCT urinalysis dipstick (Completed)       Strep B DNA probe      Patient Active Problem List   Diagnosis Date Noted  . GERD without esophagitis 07/19/2013  . Supervision of normal first pregnancy 06/21/2013  . Idiopathic scoliosis and kyphoscoliosis 01/06/2012  . 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 GBS sent TDAP offered  Rhogam given for RH negative Pediatrician: discussed. Infant feeding: plans to breastfeed. Maternity leave: discussed. Cigarette smoking: never smoked. Orders Placed This Encounter  Procedures  . Strep B DNA probe  . POCT urinalysis dipstick   No orders of the defined types were placed in this encounter.   Follow up in 1 Week.

## 2013-12-04 LAB — STREP B DNA PROBE: GBSP: NOT DETECTED

## 2013-12-10 ENCOUNTER — Ambulatory Visit (INDEPENDENT_AMBULATORY_CARE_PROVIDER_SITE_OTHER): Admitting: Obstetrics

## 2013-12-10 ENCOUNTER — Encounter: Payer: Self-pay | Admitting: Obstetrics

## 2013-12-10 VITALS — BP 119/68 | HR 74 | Temp 97.9°F | Wt 161.0 lb

## 2013-12-10 DIAGNOSIS — Z3403 Encounter for supervision of normal first pregnancy, third trimester: Secondary | ICD-10-CM

## 2013-12-10 DIAGNOSIS — O368130 Decreased fetal movements, third trimester, not applicable or unspecified: Secondary | ICD-10-CM

## 2013-12-10 LAB — POCT URINALYSIS DIPSTICK
Bilirubin, UA: NEGATIVE
Glucose, UA: NEGATIVE
KETONES UA: NEGATIVE
Leukocytes, UA: NEGATIVE
Nitrite, UA: NEGATIVE
PH UA: 7
Protein, UA: NEGATIVE
RBC UA: NEGATIVE
Spec Grav, UA: 1.01
Urobilinogen, UA: NEGATIVE

## 2013-12-10 NOTE — Progress Notes (Signed)
Subjective:    Victoria Weber is a 23 y.o. female being seen today for her obstetrical visit. She is at 4987w1d gestation. Patient reports no complaints. Fetal movement: normal.  Problem List Items Addressed This Visit    Supervision of normal first pregnancy - Primary   Relevant Orders      POCT urinalysis dipstick (Completed)     Patient Active Problem List   Diagnosis Date Noted  . GERD without esophagitis 07/19/2013  . Supervision of normal first pregnancy 06/21/2013  . Idiopathic scoliosis and kyphoscoliosis 01/06/2012  . 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 Infant feeding: plans to breastfeed. Cigarette smoking: never smoked. L&D discussion: symptoms of labor, discussed when to call, discussed what number to call, anesthetic/analgesic options reviewed and delivering clinician:  plans Physician. Postpartum supports and preparation: circumcision discussed and contraception plans discussed.  Follow up in 1 Week.

## 2013-12-14 ENCOUNTER — Encounter (HOSPITAL_COMMUNITY): Payer: Self-pay | Admitting: *Deleted

## 2013-12-14 ENCOUNTER — Inpatient Hospital Stay (HOSPITAL_COMMUNITY)
Admission: AD | Admit: 2013-12-14 | Discharge: 2013-12-14 | Disposition: A | Discharge status: Home / Self Care | Source: Ambulatory Visit | Attending: Obstetrics | Admitting: Obstetrics

## 2013-12-14 DIAGNOSIS — O471 False labor at or after 37 completed weeks of gestation: Secondary | ICD-10-CM | POA: Diagnosis not present

## 2013-12-14 DIAGNOSIS — Z3A38 38 weeks gestation of pregnancy: Secondary | ICD-10-CM | POA: Diagnosis not present

## 2013-12-14 HISTORY — DX: Unspecified infectious disease: B99.9

## 2013-12-14 NOTE — Discharge Instructions (Signed)
Braxton Hicks Contractions °Contractions of the uterus can occur throughout pregnancy. Contractions are not always a sign that you are in labor.  °WHAT ARE BRAXTON HICKS CONTRACTIONS?  °Contractions that occur before labor are called Braxton Hicks contractions, or false labor. Toward the end of pregnancy (32-34 weeks), these contractions can develop more often and may become more forceful. This is not true labor because these contractions do not result in opening (dilatation) and thinning of the cervix. They are sometimes difficult to tell apart from true labor because these contractions can be forceful and people have different pain tolerances. You should not feel embarrassed if you go to the hospital with false labor. Sometimes, the only way to tell if you are in true labor is for your health care provider to look for changes in the cervix. °If there are no prenatal problems or other health problems associated with the pregnancy, it is completely safe to be sent home with false labor and await the onset of true labor. °HOW CAN YOU TELL THE DIFFERENCE BETWEEN TRUE AND FALSE LABOR? °False Labor °· The contractions of false labor are usually shorter and not as hard as those of true labor.   °· The contractions are usually irregular.   °· The contractions are often felt in the front of the lower abdomen and in the groin.   °· The contractions may go away when you walk around or change positions while lying down.   °· The contractions get weaker and are shorter lasting as time goes on.   °· The contractions do not usually become progressively stronger, regular, and closer together as with true labor.   °True Labor °· Contractions in true labor last 30-70 seconds, become very regular, usually become more intense, and increase in frequency.   °· The contractions do not go away with walking.   °· The discomfort is usually felt in the top of the uterus and spreads to the lower abdomen and low back.   °· True labor can be  determined by your health care provider with an exam. This will show that the cervix is dilating and getting thinner.   °WHAT TO REMEMBER °· Keep up with your usual exercises and follow other instructions given by your health care provider.   °· Take medicines as directed by your health care provider.   °· Keep your regular prenatal appointments.   °· Eat and drink lightly if you think you are going into labor.   °· If Braxton Hicks contractions are making you uncomfortable:   °¨ Change your position from lying down or resting to walking, or from walking to resting.   °¨ Sit and rest in a tub of warm water.   °¨ Drink 2-3 glasses of water. Dehydration may cause these contractions.   °¨ Do slow and deep breathing several times an hour.   °WHEN SHOULD I SEEK IMMEDIATE MEDICAL CARE? °Seek immediate medical care if: °· Your contractions become stronger, more regular, and closer together.   °· You have fluid leaking or gushing from your vagina.   °· You have a fever.   °· You pass blood-tinged mucus.   °· You have vaginal bleeding.   °· You have continuous abdominal pain.   °· You have low back pain that you never had before.   °· You feel your baby's head pushing down and causing pelvic pressure.   °· Your baby is not moving as much as it used to.   °Document Released: 12/31/2004 Document Revised: 01/05/2013 Document Reviewed: 10/12/2012 °[employer]® Patient Information ©2015 [employer]. This information is not intended to replace advice given to you by your health care   provider. Make sure you discuss any questions you have with your health care provider. ° °

## 2013-12-14 NOTE — MAU Note (Signed)
Patient states she has been having irregular contractions for several days but more regular and stronger today. Denies bleeding or leaking but has a heavier vaginal discharge. Reports fetal movement.

## 2013-12-14 NOTE — MAU Note (Signed)
Ongoing contractions. Getting stronger, still irregular. No bleeding or leaking.  No recent exams

## 2013-12-15 ENCOUNTER — Ambulatory Visit (INDEPENDENT_AMBULATORY_CARE_PROVIDER_SITE_OTHER): Admitting: Obstetrics

## 2013-12-15 ENCOUNTER — Encounter: Payer: Self-pay | Admitting: Obstetrics

## 2013-12-15 VITALS — BP 115/76 | HR 76 | Temp 97.7°F | Wt 162.0 lb

## 2013-12-15 DIAGNOSIS — Z3403 Encounter for supervision of normal first pregnancy, third trimester: Secondary | ICD-10-CM

## 2013-12-15 NOTE — Progress Notes (Signed)
Subjective:    Marjory Liesletta Zidek is a 23 y.o. female being seen today for her obstetrical visit. She is at 5092w6d gestation. Patient reports no complaints. Fetal movement: normal.  Problem List Items Addressed This Visit    Supervision of normal first pregnancy - Primary   Relevant Orders      POCT urinalysis dipstick     Patient Active Problem List   Diagnosis Date Noted  . GERD without esophagitis 07/19/2013  . Supervision of normal first pregnancy 06/21/2013  . Idiopathic scoliosis and kyphoscoliosis 01/06/2012  . Airway hyperreactivity 01/01/2012    Objective:    BP 115/76 mmHg  Pulse 76  Temp(Src) 97.7 F (36.5 C)  Wt 162 lb (73.483 kg)  LMP 03/25/2013 FHT: 150 BPM  Uterine Size: size equals dates  Presentations: unsure  Pelvic Exam: Deferred   NST:  Reactive.  No UC's.  Assessment:    Pregnancy @ 7292w6d weeks   Plan:   Plans for delivery: Vaginal anticipated; labs reviewed; problem list updated Counseling: Consent signed. Infant feeding: plans to breastfeed. Cigarette smoking: never smoked. L&D discussion: symptoms of labor, discussed when to call, discussed what number to call, anesthetic/analgesic options reviewed and delivering clinician:  plans Physician. Postpartum supports and preparation: circumcision discussed and contraception plans discussed.  Follow up in 1 Week.

## 2013-12-22 ENCOUNTER — Encounter: Payer: Self-pay | Admitting: Obstetrics

## 2013-12-22 ENCOUNTER — Ambulatory Visit (INDEPENDENT_AMBULATORY_CARE_PROVIDER_SITE_OTHER): Admitting: Obstetrics

## 2013-12-22 VITALS — BP 126/79 | HR 83 | Temp 98.3°F | Wt 162.0 lb

## 2013-12-22 DIAGNOSIS — Z3403 Encounter for supervision of normal first pregnancy, third trimester: Secondary | ICD-10-CM

## 2013-12-22 LAB — POCT URINALYSIS DIPSTICK
BILIRUBIN UA: NEGATIVE
Blood, UA: NEGATIVE
Glucose, UA: NEGATIVE
KETONES UA: NEGATIVE
LEUKOCYTES UA: NEGATIVE
Nitrite, UA: NEGATIVE
Protein, UA: NEGATIVE
SPEC GRAV UA: 1.015
Urobilinogen, UA: NEGATIVE
pH, UA: 7

## 2013-12-22 NOTE — Progress Notes (Signed)
Subjective:    Victoria Weber is a 23 y.o. female being seen today for her obstetrical visit. She is at 3625w6d gestation. Patient reports no complaints. Fetal movement: normal.  Problem List Items Addressed This Visit    Supervision of normal first pregnancy - Primary   Relevant Orders      POCT urinalysis dipstick (Completed)     Patient Active Problem List   Diagnosis Date Noted  . GERD without esophagitis 07/19/2013  . Supervision of normal first pregnancy 06/21/2013  . Idiopathic scoliosis and kyphoscoliosis 01/06/2012  . Airway hyperreactivity 01/01/2012    Objective:    BP 126/79 mmHg  Pulse 83  Temp(Src) 98.3 F (36.8 C)  Wt 162 lb (73.483 kg)  LMP 03/25/2013 FHT: 150 BPM  Uterine Size: size equals dates  Presentations: cephalic  Pelvic Exam: Deferred    Assessment:    Pregnancy @ 6525w6d weeks .  Doing well.  Plan:   Plans for delivery: Vaginal anticipated; labs reviewed; problem list updated Counseling: Consent signed. Infant feeding: plans to breastfeed. Cigarette smoking: never smoked. L&D discussion: symptoms of labor, discussed when to call, discussed what number to call, anesthetic/analgesic options reviewed and delivering clinician:  plans Physician. Postpartum supports and preparation: circumcision discussed and contraception plans discussed.  Follow up in 1 Week.

## 2013-12-29 ENCOUNTER — Encounter: Payer: Self-pay | Admitting: Obstetrics

## 2013-12-29 ENCOUNTER — Ambulatory Visit (INDEPENDENT_AMBULATORY_CARE_PROVIDER_SITE_OTHER): Admitting: Obstetrics

## 2013-12-29 VITALS — BP 127/83 | HR 91 | Temp 98.8°F | Wt 160.0 lb

## 2013-12-29 DIAGNOSIS — Z3403 Encounter for supervision of normal first pregnancy, third trimester: Secondary | ICD-10-CM

## 2013-12-29 LAB — POCT URINALYSIS DIPSTICK
BILIRUBIN UA: NEGATIVE
GLUCOSE UA: NEGATIVE
KETONES UA: NEGATIVE
Leukocytes, UA: NEGATIVE
NITRITE UA: NEGATIVE
Protein, UA: NEGATIVE
RBC UA: NEGATIVE
Spec Grav, UA: <=1.005
Urobilinogen, UA: NEGATIVE
pH, UA: 6.5

## 2013-12-29 NOTE — Progress Notes (Signed)
Subjective:    Victoria Weber is a 23 y.o. female being seen today for her obstetrical visit. She is at 5815w6d gestation. Patient reports no complaints. Fetal movement: normal.  Problem List Items Addressed This Visit    Supervision of normal first pregnancy - Primary   Relevant Orders      POCT urinalysis dipstick (Completed)     Patient Active Problem List   Diagnosis Date Noted  . GERD without esophagitis 07/19/2013  . Supervision of normal first pregnancy 06/21/2013  . Idiopathic scoliosis and kyphoscoliosis 01/06/2012  . Airway hyperreactivity 01/01/2012    Objective:    BP 127/83 mmHg  Pulse 91  Temp(Src) 98.8 F (37.1 C)  Wt 160 lb (72.576 kg)  LMP 03/25/2013 FHT: 150 BPM  Uterine Size: size equals dates  Presentations: cephalic  Pelvic Exam:              Dilation: 1cm       Effacement: 50%             Station:  -3    Consistency: medium            Position: posterior     Assessment:    Pregnancy @ 7915w6d weeks   Plan:   Plans for delivery: Vaginal anticipated; labs reviewed; problem list updated Counseling: Consent signed. Infant feeding: plans to breastfeed. Cigarette smoking: never smoked. L&D discussion: symptoms of labor, discussed when to call, discussed what number to call, anesthetic/analgesic options reviewed and delivering clinician:  plans Physician. Postpartum supports and preparation: circumcision discussed and contraception plans discussed.  Follow up in 1 Week.

## 2014-01-02 ENCOUNTER — Inpatient Hospital Stay (HOSPITAL_COMMUNITY)
Admission: AD | Admit: 2014-01-02 | Discharge: 2014-01-02 | Disposition: A | Discharge status: Home / Self Care | Source: Ambulatory Visit | Attending: Obstetrics & Gynecology | Admitting: Obstetrics & Gynecology

## 2014-01-02 DIAGNOSIS — Z3403 Encounter for supervision of normal first pregnancy, third trimester: Secondary | ICD-10-CM

## 2014-01-02 NOTE — Progress Notes (Addendum)
Second reviewer:  Cleone SlimH [facility] Hospital[facility]NC  Reviewed D/C instructions with pt and family.  Copy of instructions given to pt.  Keep appt with Dr Clearance CootsHarper on Wednesday for induction.

## 2014-01-02 NOTE — MAU Note (Signed)
BS to triage pt

## 2014-01-03 ENCOUNTER — Encounter (HOSPITAL_COMMUNITY): Payer: Self-pay | Admitting: *Deleted

## 2014-01-03 ENCOUNTER — Inpatient Hospital Stay (HOSPITAL_COMMUNITY): Admitting: Anesthesiology

## 2014-01-03 ENCOUNTER — Inpatient Hospital Stay (HOSPITAL_COMMUNITY)
Admission: AD | Admit: 2014-01-03 | Discharge: 2014-01-06 | DRG: 766 | Disposition: A | Discharge status: Home / Self Care | Source: Ambulatory Visit | Attending: Obstetrics & Gynecology | Admitting: Obstetrics & Gynecology

## 2014-01-03 ENCOUNTER — Encounter (HOSPITAL_COMMUNITY)
Admission: AD | Disposition: A | Discharge status: Home / Self Care | Payer: Self-pay | Source: Ambulatory Visit | Attending: Obstetrics & Gynecology

## 2014-01-03 DIAGNOSIS — Z3403 Encounter for supervision of normal first pregnancy, third trimester: Secondary | ICD-10-CM | POA: Diagnosis present

## 2014-01-03 DIAGNOSIS — O480 Post-term pregnancy: Secondary | ICD-10-CM | POA: Diagnosis present

## 2014-01-03 DIAGNOSIS — Z3A40 40 weeks gestation of pregnancy: Secondary | ICD-10-CM | POA: Diagnosis present

## 2014-01-03 DIAGNOSIS — O9952 Diseases of the respiratory system complicating childbirth: Secondary | ICD-10-CM | POA: Diagnosis present

## 2014-01-03 DIAGNOSIS — O9902 Anemia complicating childbirth: Secondary | ICD-10-CM | POA: Diagnosis present

## 2014-01-03 DIAGNOSIS — J45909 Unspecified asthma, uncomplicated: Secondary | ICD-10-CM | POA: Diagnosis present

## 2014-01-03 DIAGNOSIS — D649 Anemia, unspecified: Secondary | ICD-10-CM | POA: Diagnosis present

## 2014-01-03 LAB — CBC
HEMATOCRIT: 35.9 % — AB (ref 36.0–46.0)
Hemoglobin: 12.7 g/dL (ref 12.0–15.0)
MCH: 30.5 pg (ref 26.0–34.0)
MCHC: 35.4 g/dL (ref 30.0–36.0)
MCV: 86.3 fL (ref 78.0–100.0)
PLATELETS: 169 10*3/uL (ref 150–400)
RBC: 4.16 MIL/uL (ref 3.87–5.11)
RDW: 12.7 % (ref 11.5–15.5)
WBC: 7.8 10*3/uL (ref 4.0–10.5)

## 2014-01-03 LAB — RPR

## 2014-01-03 LAB — TYPE AND SCREEN
ABO/RH(D): O POS
ANTIBODY SCREEN: NEGATIVE

## 2014-01-03 LAB — ABO/RH: ABO/RH(D): O POS

## 2014-01-03 SURGERY — Surgical Case
Anesthesia: Epidural | Site: Abdomen

## 2014-01-03 MED ORDER — LIDOCAINE-EPINEPHRINE (PF) 2 %-1:200000 IJ SOLN
INTRAMUSCULAR | Status: AC
  Filled 2014-01-03: qty 20

## 2014-01-03 MED ORDER — PHENYLEPHRINE HCL 10 MG/ML IJ SOLN
INTRAMUSCULAR | Status: DC | PRN
  Administered 2014-01-03: 80 ug via INTRAVENOUS

## 2014-01-03 MED ORDER — FENTANYL 2.5 MCG/ML BUPIVACAINE 1/10 % EPIDURAL INFUSION (WH - ANES)
14.0000 mL/h | INTRAMUSCULAR | Status: DC | PRN
  Administered 2014-01-03: 14 mL/h via EPIDURAL

## 2014-01-03 MED ORDER — NALBUPHINE HCL 10 MG/ML IJ SOLN: 5.0000 mg | INTRAMUSCULAR | Status: DC | PRN

## 2014-01-03 MED ORDER — LACTATED RINGERS IV SOLN: 500.0000 mL | INTRAVENOUS | Status: DC | PRN

## 2014-01-03 MED ORDER — SIMETHICONE 80 MG PO CHEW
80.0000 mg | CHEWABLE_TABLET | ORAL | Status: DC | PRN
  Administered 2014-01-04: 80 mg via ORAL

## 2014-01-03 MED ORDER — PROMETHAZINE HCL 25 MG/ML IJ SOLN: 6.2500 mg | INTRAMUSCULAR | Status: DC | PRN

## 2014-01-03 MED ORDER — LACTATED RINGERS IV SOLN
INTRAVENOUS | Status: DC
  Administered 2014-01-03 (×2): via INTRAVENOUS

## 2014-01-03 MED ORDER — OXYCODONE-ACETAMINOPHEN 5-325 MG PO TABS: 2.0000 | ORAL_TABLET | ORAL | Status: DC | PRN

## 2014-01-03 MED ORDER — FENTANYL 2.5 MCG/ML BUPIVACAINE 1/10 % EPIDURAL INFUSION (WH - ANES)
INTRAMUSCULAR | Status: AC
  Administered 2014-01-03: 14 mL/h via EPIDURAL
  Filled 2014-01-03: qty 125

## 2014-01-03 MED ORDER — ONDANSETRON HCL 4 MG PO TABS: 4.0000 mg | ORAL_TABLET | ORAL | Status: DC | PRN

## 2014-01-03 MED ORDER — OXYCODONE-ACETAMINOPHEN 5-325 MG PO TABS
2.0000 | ORAL_TABLET | ORAL | Status: DC | PRN
Start: 2014-01-03 — End: 2014-01-06
  Administered 2014-01-04 – 2014-01-06 (×8): 2 via ORAL
  Filled 2014-01-03 (×7): qty 2

## 2014-01-03 MED ORDER — MEASLES, MUMPS & RUBELLA VAC ~~LOC~~ INJ: 0.5000 mL | INJECTION | Freq: Once | SUBCUTANEOUS | Status: DC

## 2014-01-03 MED ORDER — NALBUPHINE HCL 10 MG/ML IJ SOLN: 5.0000 mg | Freq: Once | INTRAMUSCULAR | Status: AC | PRN

## 2014-01-03 MED ORDER — SENNOSIDES-DOCUSATE SODIUM 8.6-50 MG PO TABS
2.0000 | ORAL_TABLET | ORAL | Status: DC
  Administered 2014-01-03 – 2014-01-06 (×3): 2 via ORAL
  Filled 2014-01-03 (×3): qty 2

## 2014-01-03 MED ORDER — ONDANSETRON HCL 4 MG/2ML IJ SOLN: 4.0000 mg | Freq: Three times a day (TID) | INTRAMUSCULAR | Status: DC | PRN

## 2014-01-03 MED ORDER — NALOXONE HCL 0.4 MG/ML IJ SOLN: 0.4000 mg | INTRAMUSCULAR | Status: DC | PRN

## 2014-01-03 MED ORDER — 0.9 % SODIUM CHLORIDE (POUR BTL) OPTIME
TOPICAL | Status: DC | PRN
  Administered 2014-01-03: 700 mL

## 2014-01-03 MED ORDER — LIDOCAINE HCL (PF) 1 % IJ SOLN
30.0000 mL | INTRAMUSCULAR | Status: DC | PRN
Start: 2014-01-03 — End: 2014-01-03
  Filled 2014-01-03: qty 30

## 2014-01-03 MED ORDER — OXYTOCIN 10 UNIT/ML IJ SOLN
40.0000 [IU] | INTRAVENOUS | Status: DC | PRN
  Administered 2014-01-03: 40 [IU] via INTRAVENOUS

## 2014-01-03 MED ORDER — OXYTOCIN 40 UNITS IN LACTATED RINGERS INFUSION - SIMPLE MED: 62.5000 mL/h | INTRAVENOUS | Status: AC

## 2014-01-03 MED ORDER — LACTATED RINGERS IV SOLN
INTRAVENOUS | Status: DC
  Administered 2014-01-03: 21:00:00 via INTRAVENOUS

## 2014-01-03 MED ORDER — KETOROLAC TROMETHAMINE 30 MG/ML IJ SOLN
INTRAMUSCULAR | Status: AC
  Administered 2014-01-03: 30 mg via INTRAMUSCULAR
  Filled 2014-01-03: qty 1

## 2014-01-03 MED ORDER — PHENYLEPHRINE 40 MCG/ML (10ML) SYRINGE FOR IV PUSH (FOR BLOOD PRESSURE SUPPORT)
80.0000 ug | PREFILLED_SYRINGE | INTRAVENOUS | Status: DC | PRN
  Filled 2014-01-03: qty 2

## 2014-01-03 MED ORDER — OXYCODONE-ACETAMINOPHEN 5-325 MG PO TABS
1.0000 | ORAL_TABLET | ORAL | Status: DC | PRN
  Administered 2014-01-03 – 2014-01-06 (×3): 1 via ORAL
  Filled 2014-01-03 (×3): qty 1

## 2014-01-03 MED ORDER — CITRIC ACID-SODIUM CITRATE 334-500 MG/5ML PO SOLN
30.0000 mL | ORAL | Status: DC | PRN
  Administered 2014-01-03 (×2): 30 mL via ORAL
  Filled 2014-01-03 (×3): qty 15

## 2014-01-03 MED ORDER — KETOROLAC TROMETHAMINE 30 MG/ML IJ SOLN
30.0000 mg | Freq: Once | INTRAMUSCULAR | Status: AC
  Administered 2014-01-03: 30 mg via INTRAVENOUS
  Filled 2014-01-03: qty 1

## 2014-01-03 MED ORDER — OXYCODONE-ACETAMINOPHEN 5-325 MG PO TABS: 1.0000 | ORAL_TABLET | ORAL | Status: DC | PRN

## 2014-01-03 MED ORDER — SCOPOLAMINE 1 MG/3DAYS TD PT72: 1.0000 | MEDICATED_PATCH | Freq: Once | TRANSDERMAL | Status: DC

## 2014-01-03 MED ORDER — TETANUS-DIPHTH-ACELL PERTUSSIS 5-2.5-18.5 LF-MCG/0.5 IM SUSP
0.5000 mL | Freq: Once | INTRAMUSCULAR | Status: AC
  Administered 2014-01-04: 0.5 mL via INTRAMUSCULAR

## 2014-01-03 MED ORDER — MAGNESIUM HYDROXIDE 400 MG/5ML PO SUSP: 30.0000 mL | ORAL | Status: DC | PRN

## 2014-01-03 MED ORDER — HYDROMORPHONE HCL 1 MG/ML IJ SOLN
0.2500 mg | INTRAMUSCULAR | Status: DC | PRN
  Administered 2014-01-03: 0.5 mg via INTRAVENOUS

## 2014-01-03 MED ORDER — OXYTOCIN 40 UNITS IN LACTATED RINGERS INFUSION - SIMPLE MED
62.5000 mL/h | INTRAVENOUS | Status: DC
  Filled 2014-01-03: qty 1000

## 2014-01-03 MED ORDER — IBUPROFEN 600 MG PO TABS
600.0000 mg | ORAL_TABLET | Freq: Four times a day (QID) | ORAL | Status: DC | PRN
Start: 2014-01-03 — End: 2014-01-03

## 2014-01-03 MED ORDER — LACTATED RINGERS IV SOLN
INTRAVENOUS | Status: DC
  Administered 2014-01-03: 150 mL/h via INTRAUTERINE

## 2014-01-03 MED ORDER — DIPHENHYDRAMINE HCL 25 MG PO CAPS: 25.0000 mg | ORAL_CAPSULE | Freq: Four times a day (QID) | ORAL | Status: DC | PRN

## 2014-01-03 MED ORDER — MORPHINE SULFATE 0.5 MG/ML IJ SOLN
INTRAMUSCULAR | Status: AC
  Filled 2014-01-03: qty 10

## 2014-01-03 MED ORDER — PRENATAL MULTIVITAMIN CH
1.0000 | ORAL_TABLET | Freq: Every day | ORAL | Status: DC
  Administered 2014-01-04 – 2014-01-06 (×3): 1 via ORAL
  Filled 2014-01-03 (×3): qty 1

## 2014-01-03 MED ORDER — BUTORPHANOL TARTRATE 1 MG/ML IJ SOLN: 1.0000 mg | INTRAMUSCULAR | Status: DC | PRN

## 2014-01-03 MED ORDER — HYDROMORPHONE HCL 1 MG/ML IJ SOLN
1.0000 mg | Freq: Once | INTRAMUSCULAR | Status: AC
  Administered 2014-01-03: 1 mg via INTRAVENOUS

## 2014-01-03 MED ORDER — ONDANSETRON HCL 4 MG/2ML IJ SOLN
INTRAMUSCULAR | Status: DC | PRN
  Administered 2014-01-03: 4 mg via INTRAVENOUS

## 2014-01-03 MED ORDER — FLEET ENEMA 7-19 GM/118ML RE ENEM: 1.0000 | ENEMA | RECTAL | Status: DC | PRN

## 2014-01-03 MED ORDER — ACETAMINOPHEN 325 MG PO TABS
650.0000 mg | ORAL_TABLET | ORAL | Status: DC | PRN
  Administered 2014-01-03: 650 mg via ORAL
  Filled 2014-01-03: qty 2

## 2014-01-03 MED ORDER — LACTATED RINGERS IV SOLN: 500.0000 mL | Freq: Once | INTRAVENOUS | Status: DC

## 2014-01-03 MED ORDER — HYDROMORPHONE HCL 1 MG/ML IJ SOLN
INTRAMUSCULAR | Status: AC
  Administered 2014-01-03: 0.5 mg via INTRAVENOUS
  Filled 2014-01-03: qty 1

## 2014-01-03 MED ORDER — OXYTOCIN BOLUS FROM INFUSION: 500.0000 mL | INTRAVENOUS | Status: DC

## 2014-01-03 MED ORDER — EPHEDRINE 5 MG/ML INJ
10.0000 mg | INTRAVENOUS | Status: DC | PRN
  Filled 2014-01-03: qty 2

## 2014-01-03 MED ORDER — SODIUM BICARBONATE 8.4 % IV SOLN
INTRAVENOUS | Status: AC
  Filled 2014-01-03: qty 50

## 2014-01-03 MED ORDER — LACTATED RINGERS IV SOLN
INTRAVENOUS | Status: DC | PRN
  Administered 2014-01-03: 13:00:00 via INTRAVENOUS

## 2014-01-03 MED ORDER — ONDANSETRON HCL 4 MG/2ML IJ SOLN: 4.0000 mg | INTRAMUSCULAR | Status: DC | PRN

## 2014-01-03 MED ORDER — HYDROMORPHONE HCL 1 MG/ML IJ SOLN
INTRAMUSCULAR | Status: AC
  Administered 2014-01-03: 1 mg via INTRAVENOUS
  Filled 2014-01-03: qty 1

## 2014-01-03 MED ORDER — ONDANSETRON HCL 4 MG/2ML IJ SOLN
INTRAMUSCULAR | Status: AC
  Filled 2014-01-03: qty 2

## 2014-01-03 MED ORDER — DIPHENHYDRAMINE HCL 25 MG PO CAPS: 25.0000 mg | ORAL_CAPSULE | ORAL | Status: DC | PRN

## 2014-01-03 MED ORDER — IBUPROFEN 600 MG PO TABS
600.0000 mg | ORAL_TABLET | Freq: Four times a day (QID) | ORAL | Status: DC
  Administered 2014-01-04 – 2014-01-06 (×10): 600 mg via ORAL
  Filled 2014-01-03 (×10): qty 1

## 2014-01-03 MED ORDER — LANOLIN HYDROUS EX OINT: 1.0000 "application " | TOPICAL_OINTMENT | CUTANEOUS | Status: DC | PRN

## 2014-01-03 MED ORDER — KETOROLAC TROMETHAMINE 30 MG/ML IJ SOLN
30.0000 mg | Freq: Four times a day (QID) | INTRAMUSCULAR | Status: DC | PRN
Start: 2014-01-03 — End: 2014-01-03

## 2014-01-03 MED ORDER — SODIUM BICARBONATE 8.4 % IV SOLN
INTRAVENOUS | Status: DC | PRN
  Administered 2014-01-03: 2 mL via EPIDURAL
  Administered 2014-01-03: 6 mL via EPIDURAL
  Administered 2014-01-03: 3 mL via EPIDURAL
  Administered 2014-01-03: 2 mL via EPIDURAL

## 2014-01-03 MED ORDER — DIPHENHYDRAMINE HCL 50 MG/ML IJ SOLN: 12.5000 mg | INTRAMUSCULAR | Status: DC | PRN

## 2014-01-03 MED ORDER — OXYTOCIN 10 UNIT/ML IJ SOLN
INTRAMUSCULAR | Status: AC
  Filled 2014-01-03: qty 4

## 2014-01-03 MED ORDER — MEPERIDINE HCL 25 MG/ML IJ SOLN: 6.2500 mg | INTRAMUSCULAR | Status: DC | PRN

## 2014-01-03 MED ORDER — CEFAZOLIN SODIUM-DEXTROSE 2-3 GM-% IV SOLR
INTRAVENOUS | Status: DC | PRN
  Administered 2014-01-03: 2 g via INTRAVENOUS

## 2014-01-03 MED ORDER — FENTANYL CITRATE 0.05 MG/ML IJ SOLN
INTRAMUSCULAR | Status: DC | PRN
  Administered 2014-01-03: 100 ug via INTRAVENOUS

## 2014-01-03 MED ORDER — SODIUM CHLORIDE 0.9 % IJ SOLN: 3.0000 mL | INTRAMUSCULAR | Status: DC | PRN

## 2014-01-03 MED ORDER — NALOXONE HCL 1 MG/ML IJ SOLN: 1.0000 ug/kg/h | INTRAVENOUS | Status: DC | PRN

## 2014-01-03 MED ORDER — LIDOCAINE HCL (PF) 1 % IJ SOLN
INTRAMUSCULAR | Status: DC | PRN
  Administered 2014-01-03 (×2): 5 mL

## 2014-01-03 MED ORDER — SIMETHICONE 80 MG PO CHEW
80.0000 mg | CHEWABLE_TABLET | ORAL | Status: DC
  Administered 2014-01-03 – 2014-01-06 (×3): 80 mg via ORAL
  Filled 2014-01-03 (×4): qty 1

## 2014-01-03 MED ORDER — PHENYLEPHRINE 40 MCG/ML (10ML) SYRINGE FOR IV PUSH (FOR BLOOD PRESSURE SUPPORT)
PREFILLED_SYRINGE | INTRAVENOUS | Status: AC
  Filled 2014-01-03: qty 10

## 2014-01-03 MED ORDER — DIBUCAINE 1 % RE OINT: 1.0000 "application " | TOPICAL_OINTMENT | RECTAL | Status: DC | PRN

## 2014-01-03 MED ORDER — ONDANSETRON HCL 4 MG/2ML IJ SOLN: 4.0000 mg | Freq: Four times a day (QID) | INTRAMUSCULAR | Status: DC | PRN

## 2014-01-03 MED ORDER — FERROUS SULFATE 325 (65 FE) MG PO TABS
325.0000 mg | ORAL_TABLET | Freq: Two times a day (BID) | ORAL | Status: DC
  Administered 2014-01-04 – 2014-01-06 (×5): 325 mg via ORAL
  Filled 2014-01-03 (×5): qty 1

## 2014-01-03 MED ORDER — DEXTROSE 5 % IV SOLN
500.0000 mg | Freq: Once | INTRAVENOUS | Status: AC
  Administered 2014-01-03: 500 mg via INTRAVENOUS
  Filled 2014-01-03: qty 500

## 2014-01-03 MED ORDER — MORPHINE SULFATE (PF) 0.5 MG/ML IJ SOLN
INTRAMUSCULAR | Status: DC | PRN
  Administered 2014-01-03: 1 mg via EPIDURAL
  Administered 2014-01-03: 4 mg via EPIDURAL

## 2014-01-03 MED ORDER — ZOLPIDEM TARTRATE 5 MG PO TABS: 5.0000 mg | ORAL_TABLET | Freq: Every evening | ORAL | Status: DC | PRN

## 2014-01-03 MED ORDER — KETOROLAC TROMETHAMINE 30 MG/ML IJ SOLN
30.0000 mg | Freq: Four times a day (QID) | INTRAMUSCULAR | Status: DC | PRN
Start: 2014-01-03 — End: 2014-01-03
  Administered 2014-01-03: 30 mg via INTRAMUSCULAR

## 2014-01-03 MED ORDER — WITCH HAZEL-GLYCERIN EX PADS: 1.0000 "application " | MEDICATED_PAD | CUTANEOUS | Status: DC | PRN

## 2014-01-03 MED ORDER — FENTANYL CITRATE 0.05 MG/ML IJ SOLN
INTRAMUSCULAR | Status: AC
  Filled 2014-01-03: qty 2

## 2014-01-03 MED ORDER — LACTATED RINGERS IV SOLN
INTRAVENOUS | Status: DC | PRN
  Administered 2014-01-03: 14:00:00 via INTRAVENOUS

## 2014-01-03 SURGICAL SUPPLY — 41 items
BENZOIN TINCTURE PRP APPL 2/3 (GAUZE/BANDAGES/DRESSINGS) ×2 IMPLANT
CANISTER WOUND CARE 500ML ATS (WOUND CARE) IMPLANT
CLAMP CORD UMBIL (MISCELLANEOUS) IMPLANT
CLOTH BEACON ORANGE TIMEOUT ST (SAFETY) ×2 IMPLANT
CONTAINER PREFILL 10% NBF 15ML (MISCELLANEOUS) IMPLANT
DRAPE SHEET LG 3/4 BI-LAMINATE (DRAPES) IMPLANT
DRESSING DISP NPWT PICO 4X12 (MISCELLANEOUS) IMPLANT
DRSG COVADERM 4X10 (GAUZE/BANDAGES/DRESSINGS) ×2 IMPLANT
DRSG OPSITE POSTOP 4X10 (GAUZE/BANDAGES/DRESSINGS) ×2 IMPLANT
DRSG VAC ATS LRG SENSATRAC (GAUZE/BANDAGES/DRESSINGS) IMPLANT
DRSG VAC ATS SM SENSATRAC (GAUZE/BANDAGES/DRESSINGS) IMPLANT
DURAPREP 26ML APPLICATOR (WOUND CARE) ×2 IMPLANT
ELECT REM PT RETURN 9FT ADLT (ELECTROSURGICAL) ×2
ELECTRODE REM PT RTRN 9FT ADLT (ELECTROSURGICAL) ×1 IMPLANT
EXTRACTOR VACUUM M CUP 4 TUBE (SUCTIONS) IMPLANT
GLOVE BIO SURGEON STRL SZ 6.5 (GLOVE) ×2 IMPLANT
GOWN STRL REUS W/TWL LRG LVL3 (GOWN DISPOSABLE) ×4 IMPLANT
KIT ABG SYR 3ML LUER SLIP (SYRINGE) IMPLANT
NEEDLE HYPO 25X5/8 SAFETYGLIDE (NEEDLE) ×2 IMPLANT
NS IRRIG 1000ML POUR BTL (IV SOLUTION) ×2 IMPLANT
PACK C SECTION WH (CUSTOM PROCEDURE TRAY) ×2 IMPLANT
PAD OB MATERNITY 4.3X12.25 (PERSONAL CARE ITEMS) ×2 IMPLANT
RTRCTR C-SECT PINK 25CM LRG (MISCELLANEOUS) ×2 IMPLANT
SCRUB PCMX 4 OZ (MISCELLANEOUS) ×2 IMPLANT
STAPLER VISISTAT 35W (STAPLE) IMPLANT
STRIP CLOSURE SKIN 1/2X4 (GAUZE/BANDAGES/DRESSINGS) ×2 IMPLANT
SUT MNCRL 0 VIOLET CTX 36 (SUTURE) ×2 IMPLANT
SUT MNCRL AB 3-0 PS2 27 (SUTURE) ×2 IMPLANT
SUT MONOCRYL 0 CTX 36 (SUTURE) ×2
SUT PDS AB 0 CTX 36 PDP370T (SUTURE) ×2 IMPLANT
SUT PLAIN 0 NONE (SUTURE) IMPLANT
SUT VIC AB 0 CT1 36 (SUTURE) ×4 IMPLANT
SUT VIC AB 0 CTXB 36 (SUTURE) IMPLANT
SUT VIC AB 2-0 CT1 (SUTURE) ×2 IMPLANT
SUT VIC AB 2-0 CT1 27 (SUTURE) ×1
SUT VIC AB 2-0 CT1 TAPERPNT 27 (SUTURE) ×1 IMPLANT
SUT VIC AB 2-0 SH 27 (SUTURE)
SUT VIC AB 2-0 SH 27XBRD (SUTURE) IMPLANT
TOWEL OR 17X24 6PK STRL BLUE (TOWEL DISPOSABLE) ×2 IMPLANT
TRAY FOLEY CATH 14FR (SET/KITS/TRAYS/PACK) ×2 IMPLANT
WATER STERILE IRR 1000ML POUR (IV SOLUTION) ×2 IMPLANT

## 2014-01-03 NOTE — Progress Notes (Signed)
Pt c/o pain of 7. Toradol given IV.  Pt refused to get up at this time . Rn encourage Pt to get up.

## 2014-01-03 NOTE — Anesthesia Procedure Notes (Signed)
Epidural Patient location during procedure: OB Start time: 01/03/2014 6:43 AM  Staffing Anesthesiologist: Brayton CavesJACKSON, Josef Tourigny Performed by: anesthesiologist   Preanesthetic Checklist Completed: patient identified, site marked, surgical consent, pre-op evaluation, timeout performed, IV checked, risks and benefits discussed and monitors and equipment checked  Epidural Patient position: sitting Prep: site prepped and draped and DuraPrep Patient monitoring: continuous pulse ox and blood pressure Approach: midline Location: L3-L4 Injection technique: LOR air  Needle:  Needle type: Tuohy  Needle gauge: 17 G Needle length: 9 cm and 9 Needle insertion depth: 5 cm cm Catheter type: closed end flexible Catheter size: 19 Gauge Catheter at skin depth: 10 cm Test dose: negative  Assessment Events: blood not aspirated, injection not painful, no injection resistance, negative IV test and no paresthesia  Additional Notes Patient identified.  Risk benefits discussed including failed block, incomplete pain control, headache, nerve damage, paralysis, blood pressure changes, nausea, vomiting, reactions to medication both toxic or allergic, and postpartum back pain.  Patient expressed understanding and wished to proceed.  All questions were answered.  Sterile technique used throughout procedure and epidural site dressed with sterile barrier dressing. No paresthesia or other complications noted.The patient did not experience any signs of intravascular injection such as tinnitus or metallic taste in mouth nor signs of intrathecal spread such as rapid motor block. Please see nursing notes for vital signs.

## 2014-01-03 NOTE — Anesthesia Preprocedure Evaluation (Signed)
Anesthesia Evaluation  Patient identified by MRN, date of birth, ID band Patient awake    Reviewed: Allergy & Precautions, H&P , Patient's Chart, lab work & pertinent test results  Airway Mallampati: II  TM Distance: >3 FB Neck ROM: full    Dental   Pulmonary asthma ,  breath sounds clear to auscultation        Cardiovascular Rhythm:regular Rate:Normal     Neuro/Psych    GI/Hepatic GERD-  ,  Endo/Other    Renal/GU      Musculoskeletal   Abdominal   Peds  Hematology   Anesthesia Other Findings   Reproductive/Obstetrics (+) Pregnancy                             Anesthesia Physical Anesthesia Plan  ASA: II  Anesthesia Plan: Epidural   Post-op Pain Management:    Induction:   Airway Management Planned:   Additional Equipment:   Intra-op Plan:   Post-operative Plan:   Informed Consent: I have reviewed the patients History and Physical, chart, labs and discussed the procedure including the risks, benefits and alternatives for the proposed anesthesia with the patient or authorized representative who has indicated his/her understanding and acceptance.     Plan Discussed with:   Anesthesia Plan Comments:         Anesthesia Quick Evaluation

## 2014-01-03 NOTE — Progress Notes (Signed)
Pt c/o pain 8/10 at incisional site and mid Abdomen. Describe it as pressure, soreness and "just pain". Notified Anesthesiologist Dr. Malen GauzeFoster. Gave permission to give Percocet early and may repeat times one in an hr if pt still uncomfortable.

## 2014-01-03 NOTE — Anesthesia Postprocedure Evaluation (Signed)
Anesthesia Post Note  Patient: Victoria Weber  Procedure(s) Performed: Procedure(s) (LRB): CESAREAN SECTION (N/A)  Anesthesia type: Epidural  Patient location: PACU  Post pain: Pain level controlled  Post assessment: Post-op Vital signs reviewed  Last Vitals:  Filed Vitals:   01/03/14 1500  BP: 131/60  Pulse: 95  Temp:   Resp: 18    Post vital signs: Reviewed  Level of consciousness: awake  Complications: No apparent anesthesia complications

## 2014-01-03 NOTE — Progress Notes (Signed)
Patient ID: Victoria Weber, female   DOB: [birth year], 23 y.o.   MRN: [id number] CTSP with several persistent, prolonged decelerations.  FHT: 140s; prolonged decelerations x 2; nadir 90 bpm-->decreased LTV   A/P Protracted active phase of labor Category II FHT  -->C/D; risks/benefits reviewed

## 2014-01-03 NOTE — Transfer of Care (Signed)
Immediate Anesthesia Transfer of Care Note  Patient: Victoria Weber  Procedure(s) Performed: Procedure(s): CESAREAN SECTION (N/A)  Patient Location: PACU  Anesthesia Type:Epidural  Level of Consciousness: awake, alert  and oriented  Airway & Oxygen Therapy: Patient Spontanous Breathing  Post-op Assessment: Report given to PACU RN and Post -op Vital signs reviewed and stable  Post vital signs: Reviewed and stable  Complications: No apparent anesthesia complications

## 2014-01-03 NOTE — Progress Notes (Signed)
Pt refused to get up requested to wait at this time.

## 2014-01-03 NOTE — H&P (Signed)
Marjory Liesletta Kidney is a [age] y.o. female presenting with contractions Maternal Medical History:  Reason for admission: Contractions.   Contractions: Onset was 13-24 hours ago.    Fetal activity: Perceived fetal activity is normal.    Prenatal complications: no prenatal complications Prenatal Complications - Diabetes: none.    OB History    Gravida Para Term Preterm AB TAB SAB Ectopic Multiple Living   1              Past Medical History  Diagnosis Date  . Asthma   . Seasonal allergies   . Positive PPD, treated   . Concussion   . Infection     UTI   Past Surgical History  Procedure Laterality Date  . Wisdom tooth extraction    . No past surgeries     Family History: She was adopted. Family history is unknown by patient. Social History:  reports that she has never smoked. She has never used smokeless tobacco. She reports that she drinks alcohol. She reports that she does not use illicit drugs.     Review of Systems  Constitutional: Negative for fever.  Eyes: Negative for blurred vision.  Respiratory: Negative for shortness of breath.   Gastrointestinal: Negative for vomiting.  Skin: Negative for rash.  Neurological: Negative for headaches.    Dilation: 6.5 Effacement (%): 90 Station: -1 Exam by:: sowder Blood pressure 101/67, pulse 94, temperature 98.2 F (36.8 C), temperature source Oral, resp. rate 20, height 5\' 3"  (1.6 m), weight 74.662 kg (164 lb 9.6 oz), last menstrual period 03/25/2013, SpO2 98 %. Maternal Exam:  Abdomen: not evaluated.  Introitus: not evaluated.   Cervix: Cervix evaluated by digital exam.     Fetal Exam Fetal Monitor Review: Baseline rate: 140.  Variability: moderate (6-25 bpm).   Pattern: accelerations present and no decelerations.    Fetal State Assessment: Category I - tracings are normal.     Physical Exam  Constitutional: She appears well-developed.  HENT:  Head: Normocephalic.  Neck: Neck supple. No thyromegaly present.   Cardiovascular: Normal rate and regular rhythm.   Respiratory: Breath sounds normal.  GI: Soft. Bowel sounds are normal.  Skin: No rash noted.    Prenatal labs: ABO, Rh: --/--/O POS (12/21 0555) Antibody: NEG (12/21 0555) Rubella: 2.67 (06/08 1431) RPR: NON REAC (09/02 1704)  HBsAg: NEGATIVE (06/08 1431)  HIV: NONREACTIVE (09/02 1704)  GBS: NOT DETECTED (11/20 1059)   Assessment/Plan: [place] @ 4948w4d. Active labor.  Category I FHT.  Admit Expectant management Anticipate an NSVD   JACKSON-MOORE,Tanyia Grabbe A 01/03/2014, 8:38 AM

## 2014-01-03 NOTE — Progress Notes (Signed)
Dr Arby BarretteHatchett notified of patient's headache and extreme numbness.

## 2014-01-03 NOTE — Progress Notes (Signed)
Marjory Liesletta Halteman is a 23 y.o. G1P0 at 4150w4d by LMP admitted for active labor  Subjective: C/O HA  Objective: BP 110/66 mmHg  Pulse 111  Temp(Src) 98.2 F (36.8 C) (Oral)  Resp 20  Ht 5\' 3"  (1.6 m)  Wt 74.662 kg (164 lb 9.6 oz)  BMI 29.16 kg/m2  SpO2 100%  LMP 03/25/2013      FHT:  FHR: 140 bpm, variability: moderate,  accelerations:  Present,  decelerations:  Present prolonged/late UC:   irregular, every 2 - 4 minutes SVE:   Dilation: 6 Effacement (%): 90 Station: -2 Exam by:: sowder, RNC AROM-->moderate meconium Prominent ischial spines Labs: Lab Results  Component Value Date   WBC 7.8 01/03/2014   HGB 12.7 01/03/2014   HCT 35.9* 01/03/2014   MCV 86.3 01/03/2014   PLT 169 01/03/2014    Assessment / Plan: Protracted active phase  Labor: See above Preeclampsia:  n/a Fetal Wellbeing:  Category II and with good LTV Pain Control:  Epidural I/D:  n/a Anticipated MOD:  NSVD Amnioinfusion Monitor closely JACKSON-MOORE,Loraine Freid A 01/03/2014, 11:49 AM

## 2014-01-03 NOTE — Progress Notes (Signed)
Pt c/o pain with no relief from epidural.  Neg aspiration of epi catheter.  Level to cold after dosing was T10

## 2014-01-03 NOTE — Lactation Note (Signed)
This note was copied from the chart of Victoria Weber Beed. Lactation Consultation Note Initial visit at 5 hours of age.  Mom reports a good feeding on right breast.  Mom is concerned about a nipple piercing on left breast and has had some white leakage during pregnancy and is concerned about infection.  Discussed with mom her body makes colostrum in early pregnancy and no signs of infection are noted.  Further discussed with mom that she may notice leaking from the where piercing was placed.  Mom knows to keep jewelry out when she is breast feeding.  Mom plans to pump when she returns to work in a few weeks.  She plans to contact insurance company tomorrow to obtain pump.  Encouraged mom to exclusively pump baby for 1st 2 weeks to establish milk supply.  Baby asleep in visitors arms and awakened and placed STS for feeding attempt.  Baby too sleepy at this time.  [facility] resources given and discussed.  Encouraged to feed with early cues on demand.  Early newborn behavior discussed.  Hand expression demonstrated with colostrum visible.  Mom to call for assist as needed.    Patient Name: Victoria Weber Hourihan FAOZH'YToday's Date: 01/03/2014 Reason for consult: Initial assessment   Maternal Data Has patient been taught Hand Expression?: Yes Does the patient have breastfeeding experience prior to this delivery?: No  Feeding Feeding Type: Breast Fed  LATCH Score/Interventions                      Lactation Tools Discussed/Used     Consult Status Consult Status: Follow-up Date: 01/04/14 Follow-up type: In-patient    [name], Jana Lynn 01/03/2014, 6:34 PM

## 2014-01-03 NOTE — Op Note (Signed)
Cesarean Section Procedure Note   Victoria Weber   01/03/2014  Indications: Category II Fetal Heart Tracing   Pre-operative Diagnosis: Intrauterine pregnancy at 8381w4d, protracted active phase of labor, Category II Fetal Heart Tracing  Post-operative Diagnosis: Same   Surgeon: Antionette CharJACKSON-MOORE,Fabiana Dromgoole A  Assistants: none  Anesthesia: epidural  Procedure Details:  The patient was seen in the Holding Room. The risks, benefits, complications, treatment options, and expected outcomes were discussed with the patient. The patient concurred with the proposed plan, giving informed consent. The patient was identified as Victoria Weber and the procedure verified as C-Section Delivery. A Time Out was held and the above information confirmed.  After induction of anesthesia, the patient was draped and prepped in the usual sterile manner. A transverse incision was made and carried down through the subcutaneous tissue to the fascia. The fascial incision was made and extended transversely. The fascia was separated from the underlying rectus tissue superiorly. The peritoneum was identified and entered. The peritoneal incision was extended longitudinally. The utero-vesical peritoneal reflection was incised transversely and the bladder flap was bluntly freed from the lower uterine segment. A low transverse uterine incision was made. Delivered from cephalic presentation was a 6 ilb 13 oz living newborn female infant. APGAR (1 MIN): 8  APGAR (5 MINS): 9     A cord ph was not sent. The umbilical cord was clamped and cut cord. A sample was obtained for evaluation. The placenta was removed Intact and appeared normal.  The uterine incision was closed with running locked sutures of 1-0 Monocryl. A second imbricating layer of the same suture was placed.  Hemostasis was observed.  The parieto peritoneum was closed in a running fashion with 2-0 Vicryl.  The fascia was then reapproximated with running sutures of 0 Vicryl.  The skin was  closed with suture.  Instrument, sponge, and needle counts were correct prior the abdominal closure and were correct at the conclusion of the case.    Findings: Occiput posterior   Estimated Blood Loss: 600 ml  Total IV Fluids: per Anesthesiology  Urine Output: per Anesthesiology  Specimens: Placenta to Pathology  Complications: no complications  Disposition: PACU - hemodynamically stable.  Maternal Condition: stable   Baby condition / location:  Couplet care / Skin to Skin    Signed: Surgeon(s): Antionette CharLisa Jackson-Moore, MD

## 2014-01-04 ENCOUNTER — Encounter (HOSPITAL_COMMUNITY): Payer: Self-pay | Admitting: Obstetrics & Gynecology

## 2014-01-04 LAB — CBC
HEMATOCRIT: 24.8 % — AB (ref 36.0–46.0)
HEMOGLOBIN: 8.7 g/dL — AB (ref 12.0–15.0)
MCH: 30.4 pg (ref 26.0–34.0)
MCHC: 35.1 g/dL (ref 30.0–36.0)
MCV: 86.7 fL (ref 78.0–100.0)
Platelets: 129 10*3/uL — ABNORMAL LOW (ref 150–400)
RBC: 2.86 MIL/uL — ABNORMAL LOW (ref 3.87–5.11)
RDW: 12.7 % (ref 11.5–15.5)
WBC: 10.5 10*3/uL (ref 4.0–10.5)

## 2014-01-04 NOTE — Lactation Note (Signed)
This note was copied from the chart of Victoria Marjory Liesletta Mcinturff. Lactation Consultation Note  Patient Name: Victoria Marjory Liesletta Pilar RUEAV'WToday's Date: 01/04/2014 Reason for consult: Follow-up assessment of this mom and baby at 32 hours pp.  Mom states that baby has been latching and breastfeeding well and fed for 35 minutes about an hour ago.  Most recent LATCH score =8 earlier today. LC encouraged continued cue feedings and mom to call LC as needed.   Maternal Data    Feeding Feeding Type: Breast Fed Length of feed: 35 min  LATCH Score/Interventions            Most recent LATCH score=8 per RN assessment          Lactation Tools Discussed/Used   Cue feedings and LC resources  Consult Status Consult Status: Follow-up Date: 01/05/14 Follow-up type: In-patient    Warrick ParisianBryant, Duc Crocket [facility] 01/04/2014, 9:33 PM

## 2014-01-04 NOTE — Progress Notes (Signed)
Subjective: Postpartum Day 1: Cesarean Delivery Patient reports tolerating PO and no problems voiding.    Objective: Vital signs in last 24 hours: Temp:  [97.3 F (36.3 C)-98.6 F (37 C)] 98.4 F (36.9 C) (12/22 0534) Pulse Rate:  [71-144] 81 (12/22 0534) Resp:  [15-22] 20 (12/22 0534) BP: (90-131)/(45-102) 121/64 mmHg (12/22 0534) SpO2:  [96 %-100 %] 96 % (12/21 2215)  Physical Exam:  General: alert and no distress Lochia: appropriate Uterine Fundus: firm Incision: healing well DVT Evaluation: No evidence of DVT seen on physical exam.   Recent Labs  01/03/14 0555 01/04/14 0535  HGB 12.7 8.7*  HCT 35.9* 24.8*    Assessment/Plan: Status post Cesarean section. Doing well postoperatively.  Anemia.  Clinically stable.  Continue current care.  Sianne Tejada A 01/04/2014, 8:48 AM

## 2014-01-04 NOTE — Anesthesia Postprocedure Evaluation (Signed)
Anesthesia Post Note  Patient: Victoria Weber  Procedure(s) Performed: Procedure(s) (LRB): CESAREAN SECTION (N/A)  Anesthesia type: Epidural  Patient location: Mother/Baby  Post pain: Pain level controlled  Post assessment: Post-op Vital signs reviewed  Last Vitals:  Filed Vitals:   01/04/14 0534  BP: 121/64  Pulse: 81  Temp: 36.9 C  Resp: 20    Post vital signs: Reviewed  Level of consciousness:alert  Complications: No apparent anesthesia complications

## 2014-01-04 NOTE — Addendum Note (Signed)
Addendum  created 01/04/14 0743 by Algis GreenhouseLinda A Jalie Eiland, CRNA   Modules edited: Notes Section   Notes Section:  File: [id number]

## 2014-01-05 ENCOUNTER — Inpatient Hospital Stay (HOSPITAL_COMMUNITY): Admission: RE | Admit: 2014-01-05 | Source: Ambulatory Visit

## 2014-01-05 NOTE — Progress Notes (Signed)
Subjective: Postpartum Day 2: Cesarean Delivery Patient reports tolerating PO, + flatus and no problems voiding.    Objective: Vital signs in last 24 hours: Temp:  [98.1 F (36.7 C)-98.4 F (36.9 C)] 98.4 F (36.9 C) (12/23 0626) Pulse Rate:  [87-106] 106 (12/23 0626) Resp:  [18] 18 (12/23 0626) BP: (109-119)/(51-71) 110/51 mmHg (12/23 0626) SpO2:  [98 %-100 %] 100 % (12/23 0626)  Physical Exam:  General: alert and no distress Lochia: appropriate Uterine Fundus: firm Incision: healing well DVT Evaluation: No evidence of DVT seen on physical exam.   Recent Labs  01/03/14 0555 01/04/14 0535  HGB 12.7 8.7*  HCT 35.9* 24.8*    Assessment/Plan: Status post Cesarean section. Doing well postoperatively.  Continue current care.  Skarlett Sedlacek A 01/05/2014, 8:47 AM

## 2014-01-05 NOTE — Lactation Note (Signed)
This note was copied from the chart of Victoria Marjory Liesletta Matheny. Lactation Consultation Note Follow up visit at 52 hours of age.  Mom reports baby just had a small feeding attempt, but is sleepy.  Baby has had 12 feedings with 2 bottles.  Mom reports she wants to give a bottle during the night.  Encouraged mom cluster feeding will help with milk supply.  Mom reports pain on right nipple, but feels better now.  Encouraged mom to call for assist with feeding to check latch.  Baby has adequate output per chart.  Mom denies concerns at this time.   Patient Name: Victoria Weber AVWUJ'WToday's Date: 01/05/2014 Reason for consult: Follow-up assessment   Maternal Data    Feeding Feeding Type: Breast Fed Length of feed: 2 min  LATCH Score/Interventions                      Lactation Tools Discussed/Used     Consult Status Consult Status: Follow-up Date: 01/06/14 Follow-up type: In-patient    [name], Jana Lynn 01/05/2014, 6:25 PM

## 2014-01-06 ENCOUNTER — Other Ambulatory Visit: Payer: Self-pay | Admitting: Obstetrics

## 2014-01-06 MED ORDER — OXYCODONE-ACETAMINOPHEN 5-325 MG PO TABS: 1.0000 | ORAL_TABLET | ORAL | Status: DC | PRN

## 2014-01-06 MED ORDER — IBUPROFEN 600 MG PO TABS: 600.0000 mg | ORAL_TABLET | Freq: Four times a day (QID) | ORAL | Status: DC | PRN

## 2014-01-06 MED ORDER — FUSION PLUS PO CAPS: 1.0000 | ORAL_CAPSULE | Freq: Every day | ORAL | Status: DC

## 2014-01-06 NOTE — Discharge Summary (Signed)
Physician Discharge Summary  Patient ID: Victoria Weber MRN: [id number] DOB/AGE: 1990/10/25 23 y.o.  Admit date: (Not on file) Discharge date: 01/06/2014  Admission Diagnoses: Term pregnancy.  Active labor.  Discharge Diagnoses:  Same.  Protracted active phase of labor with Category 2 fetal heart tracing Active Problems:   * No active hospital problems. *   Discharged Condition: good  Hospital Course: Admitted in active labor.  Essentially made no progress over the next 6-8 hours and had repetitive prolonged variable FHR decelerations.  Primary LTCS was done without complications.  Postoperative course was uncomplicated.  Mother and baby discharged home in good condition.  Consults: None  Significant Diagnostic Studies: labs: CBC  Treatments: IV hydration and analgesia: Percocet, Ibup rofen  Discharge Exam: Last menstrual period 03/25/2013, unknown if currently breastfeeding. General appearance: alert and no distress Resp: clear to auscultation bilaterally Cardio: regular rate and rhythm, S1, S2 normal, no murmur, click, rub or gallop GI: normal findings: soft, non-tender and uterus firm Extremities: extremities normal, atraumatic, no cyanosis or edema Incision/Wound:  C, D, and Intact  Disposition: 01-Home or Self Care     Medication List       This list is accurate as of: 01/06/14 12:37 PM.  Always use your most recent med list.               albuterol 108 (90 BASE) MCG/ACT inhaler  Commonly known as:  PROVENTIL HFA;VENTOLIN HFA  Inhale 2 puffs into the lungs every 6 (six) hours as needed for wheezing or shortness of breath.     cetirizine 10 MG tablet  Commonly known as:  ZYRTEC  Take 10 mg by mouth daily.     FUSION PLUS Caps  Take 1 capsule by mouth daily before breakfast.     ibuprofen 600 MG tablet  Commonly known as:  ADVIL,MOTRIN  Take 1 tablet (600 mg total) by mouth every 6 (six) hours as needed for mild pain.     omeprazole 20 MG capsule   Commonly known as:  PRILOSEC  Take 1 capsule (20 mg total) by mouth 2 (two) times daily before a meal.     oxyCODONE-acetaminophen 5-325 MG per tablet  Commonly known as:  PERCOCET/ROXICET  Take 1-2 tablets by mouth every 4 (four) hours as needed for moderate pain or severe pain (for pain scale equal to or greater than 7).     prenatal multivitamin Tabs tablet  Take 1 tablet by mouth daily at 12 noon.         Signed: HARPER,CHARLES A 01/06/2014, 12:37 PM

## 2014-01-06 NOTE — Discharge Summary (Signed)
Subjective: Postpartum Day 3: Cesarean Delivery Patient reports tolerating PO, + flatus, + BM and no problems voiding.    Objective: Vital signs in last 24 hours: Temp:  [97.7 F (36.5 C)-98 F (36.7 C)] 97.7 F (36.5 C) (12/24 0639) Pulse Rate:  [79-88] 88 (12/24 0639) Resp:  [18] 18 (12/24 0639) BP: (113-115)/(65-73) 113/73 mmHg (12/24 0639) SpO2:  [100 %] 100 % (12/24 16100639)  Physical Exam:  General: alert and no distress Lochia: appropriate Uterine Fundus: firm Incision: healing well DVT Evaluation: No evidence of DVT seen on physical exam.   Recent Labs  01/04/14 0535  HGB 8.7*  HCT 24.8*    Assessment/Plan: Status post Cesarean section. Doing well postoperatively.  Anemia.  Clinically stable.  Iron Rx. Discharge home with standard precautions and return to clinic in 2 weeks.  Maddix Kliewer A 01/06/2014, 9:19 AM

## 2014-01-10 ENCOUNTER — Encounter: Payer: Self-pay | Admitting: *Deleted

## 2014-01-11 ENCOUNTER — Encounter: Payer: Self-pay | Admitting: Obstetrics & Gynecology

## 2014-01-17 ENCOUNTER — Encounter: Payer: Self-pay | Admitting: Obstetrics

## 2014-01-17 ENCOUNTER — Ambulatory Visit (INDEPENDENT_AMBULATORY_CARE_PROVIDER_SITE_OTHER): Admitting: Obstetrics

## 2014-01-17 DIAGNOSIS — R52 Pain, unspecified: Secondary | ICD-10-CM

## 2014-01-17 DIAGNOSIS — Z3009 Encounter for other general counseling and advice on contraception: Secondary | ICD-10-CM

## 2014-01-17 DIAGNOSIS — K5900 Constipation, unspecified: Secondary | ICD-10-CM

## 2014-01-17 LAB — POCT URINALYSIS DIPSTICK
BILIRUBIN UA: NEGATIVE
Glucose, UA: NEGATIVE
KETONES UA: NEGATIVE
Leukocytes, UA: NEGATIVE
Nitrite, UA: NEGATIVE
Protein, UA: NEGATIVE
Spec Grav, UA: 1.015
Urobilinogen, UA: NEGATIVE
pH, UA: 6

## 2014-01-17 MED ORDER — MAGNESIUM HYDROXIDE 400 MG/5ML PO SUSP: 30.0000 mL | Freq: Every evening | ORAL | Status: DC | PRN

## 2014-01-17 MED ORDER — OXYCODONE-ACETAMINOPHEN 5-325 MG PO TABS: 1.0000 | ORAL_TABLET | ORAL | Status: DC | PRN

## 2014-01-17 MED ORDER — DOCUSATE SODIUM 100 MG PO CAPS: 100.0000 mg | ORAL_CAPSULE | Freq: Two times a day (BID) | ORAL | Status: DC

## 2014-01-18 DIAGNOSIS — K5900 Constipation, unspecified: Secondary | ICD-10-CM | POA: Insufficient documentation

## 2014-01-18 NOTE — Progress Notes (Signed)
Subjective:     Victoria Weber is a 24 y.o. female who presents for a postpartum visit. She is 2 weeks postpartum following a low cervical transverse Cesarean section. I have fully reviewed the prenatal and intrapartum course. The delivery was at 40.4 gestational weeks. Outcome: primary cesarean section, low transverse incision. Anesthesia: epidural. Postpartum course has been normal. Baby's course has been normal. Baby is feeding by breast. Bleeding thin lochia. Bowel function is not normal.  Has constipation, chronic. Bladder function is normal. Patient is not sexually active. Contraception method is abstinence. Postpartum depression screening: negative.  Tobacco, alcohol and substance abuse history reviewed.  Adult immunizations reviewed including TDAP, rubella and varicella.  The following portions of the patient's history were reviewed and updated as appropriate: allergies, current medications, past family history, past medical history, past social history, past surgical history and problem list.  Review of Systems A comprehensive review of systems was negative.   Objective:   PE:      Abdomen:  Soft, NT.  Incision C, D, I.   BP 142/77 mmHg  Pulse 87  Temp(Src) 98.7 F (37.1 C)  Ht 5\' 3"  (1.6 m)  Wt 149 lb (67.586 kg)  BMI 26.40 kg/m2  LMP 03/25/2013  Breastfeeding? Yes     Assessment:    Postpartum, 2 weeks.  Doing well.   Constipation.  Breast feeding.                                                   Contraceptive counseling done.  Plan:    1. Contraception: Progestin-only OCP. 2. MOM and Colace, along with dietary changes of increased fluids and fiber recommended for constipation. 3. Follow up in: 4 weeks or as needed.   Healthy lifestyle practices reviewed

## 2014-01-19 LAB — CULTURE, OB URINE

## 2014-01-24 ENCOUNTER — Telehealth: Payer: Self-pay | Admitting: *Deleted

## 2014-01-24 ENCOUNTER — Encounter: Payer: Self-pay | Admitting: *Deleted

## 2014-01-24 NOTE — Telephone Encounter (Signed)
Patient called to request a letter for maternity leave. 01/24/2014 Attempt to call patient - mail box full

## 2014-02-14 ENCOUNTER — Ambulatory Visit: Admitting: Obstetrics

## 2014-02-28 ENCOUNTER — Ambulatory Visit: Admitting: Obstetrics

## 2014-03-28 ENCOUNTER — Ambulatory Visit (INDEPENDENT_AMBULATORY_CARE_PROVIDER_SITE_OTHER): Admitting: Obstetrics

## 2014-03-28 ENCOUNTER — Encounter: Payer: Self-pay | Admitting: Obstetrics

## 2014-03-28 DIAGNOSIS — K5900 Constipation, unspecified: Secondary | ICD-10-CM

## 2014-03-28 DIAGNOSIS — Z30019 Encounter for initial prescription of contraceptives, unspecified: Secondary | ICD-10-CM | POA: Diagnosis not present

## 2014-03-28 DIAGNOSIS — R102 Pelvic and perineal pain: Secondary | ICD-10-CM

## 2014-03-28 DIAGNOSIS — R399 Unspecified symptoms and signs involving the genitourinary system: Secondary | ICD-10-CM

## 2014-03-28 LAB — CBC WITH DIFFERENTIAL/PLATELET
BASOS ABS: 0 10*3/uL (ref 0.0–0.1)
Basophils Relative: 0 % (ref 0–1)
Eosinophils Absolute: 0.1 10*3/uL (ref 0.0–0.7)
Eosinophils Relative: 3 % (ref 0–5)
HCT: 38.1 % (ref 36.0–46.0)
Hemoglobin: 12.4 g/dL (ref 12.0–15.0)
LYMPHS PCT: 44 % (ref 12–46)
Lymphs Abs: 1.1 10*3/uL (ref 0.7–4.0)
MCH: 27.7 pg (ref 26.0–34.0)
MCHC: 32.5 g/dL (ref 30.0–36.0)
MCV: 85.2 fL (ref 78.0–100.0)
MPV: 10.3 fL (ref 8.6–12.4)
Monocytes Absolute: 0.2 10*3/uL (ref 0.1–1.0)
Monocytes Relative: 9 % (ref 3–12)
Neutro Abs: 1.1 10*3/uL — ABNORMAL LOW (ref 1.7–7.7)
Neutrophils Relative %: 44 % (ref 43–77)
Platelets: 252 10*3/uL (ref 150–400)
RBC: 4.47 MIL/uL (ref 3.87–5.11)
RDW: 13.4 % (ref 11.5–15.5)
WBC: 2.6 10*3/uL — AB (ref 4.0–10.5)

## 2014-03-28 LAB — COMPREHENSIVE METABOLIC PANEL
ALT: 29 U/L (ref 0–35)
AST: 28 U/L (ref 0–37)
Albumin: 4.5 g/dL (ref 3.5–5.2)
Alkaline Phosphatase: 49 U/L (ref 39–117)
BUN: 8 mg/dL (ref 6–23)
CALCIUM: 9.4 mg/dL (ref 8.4–10.5)
CO2: 25 meq/L (ref 19–32)
CREATININE: 0.79 mg/dL (ref 0.50–1.10)
Chloride: 103 mEq/L (ref 96–112)
GLUCOSE: 65 mg/dL — AB (ref 70–99)
Potassium: 3.9 mEq/L (ref 3.5–5.3)
SODIUM: 140 meq/L (ref 135–145)
Total Bilirubin: 0.6 mg/dL (ref 0.2–1.2)
Total Protein: 6.9 g/dL (ref 6.0–8.3)

## 2014-03-28 LAB — POCT URINALYSIS DIPSTICK
Bilirubin, UA: NEGATIVE
Blood, UA: 250
GLUCOSE UA: NEGATIVE
KETONES UA: NEGATIVE
Leukocytes, UA: NEGATIVE
Nitrite, UA: NEGATIVE
PROTEIN UA: NEGATIVE
Spec Grav, UA: 1.02
UROBILINOGEN UA: NEGATIVE
pH, UA: 5

## 2014-03-28 LAB — TSH: TSH: 0.853 u[IU]/mL (ref 0.350–4.500)

## 2014-03-28 NOTE — Progress Notes (Signed)
Subjective:     Victoria Weber is a 24 y.o. female who presents for a postpartum visit. She is 3 months postpartum following a low cervical transverse Cesarean section. I have fully reviewed the prenatal and intrapartum course. The delivery was at 41 gestational weeks. Outcome: primary cesarean section, low transverse incision. Anesthesia: epidural. Postpartum course has been abnormal, complicated by difficulty with urination and constipation. Baby's course has been normal. Baby is feeding by bottle - Enfamil with Iron. Bleeding no bleeding. Bowel function is abnormal: constipation. Bladder function is abnormal: can't tell when she has to urinate - does not feel the urge. Patient is not sexually active. Contraception method is abstinence. Postpartum depression screening: negative.  Tobacco, alcohol and substance abuse history reviewed.  Adult immunizations reviewed including TDAP, rubella and varicella.  The following portions of the patient's history were reviewed and updated as appropriate: allergies, current medications, past family history, past medical history, past social history, past surgical history and problem list.  Review of Systems Gastrointestinal: positive for abdominal pain and constipation Genitourinary:positive for vaginal discharge and difficulty recognizing sensation to urinate   Objective:    BP 118/77 mmHg  Pulse 76  Temp(Src) 98.1 F (36.7 C)  Ht 5\' 3"  (1.6 m)  Wt 137 lb (62.143 kg)  BMI 24.27 kg/m2  LMP 03/04/2014  Breastfeeding? No  General:  alert and no distress   Breasts:  inspection negative, no nipple discharge or bleeding, no masses or nodularity palpable  Lungs: clear to auscultation bilaterally  Heart:  regular rate and rhythm, S1, S2 normal, no murmur, click, rub or gallop  Abdomen: abnormal findings:  mild tenderness in the lower abdomen   Vulva:  normal  Vagina: normal vagina  Cervix:  no cervical motion tenderness  Corpus:  tender.  NSSC.  Adnexa:  no  mass, fullness, tenderness  Rectal Exam: Not performed.          Assessment:     Normal postpartum exam. Pap smear not done at today's visit. Constipation Contraceptive counseling  Plan:    1. Contraception: Options discussed 2. General Health Panel drawn 3. Follow up in: 2 weeks or as needed.   4. May need GI referral for ? IBS  Healthy lifestyle practices reviewed

## 2014-03-30 LAB — SURESWAB, VAGINOSIS/VAGINITIS PLUS
Atopobium vaginae: NOT DETECTED Log (cells/mL)
C. TROPICALIS, DNA: NOT DETECTED
C. albicans, DNA: DETECTED — AB
C. glabrata, DNA: NOT DETECTED
C. parapsilosis, DNA: NOT DETECTED
C. trachomatis RNA, TMA: NOT DETECTED
Gardnerella vaginalis: 6 Log (cells/mL)
LACTOBACILLUS SPECIES: NOT DETECTED Log (cells/mL)
MEGASPHAERA SPECIES: NOT DETECTED Log (cells/mL)
N. gonorrhoeae RNA, TMA: NOT DETECTED
T. VAGINALIS RNA, QL TMA: NOT DETECTED

## 2014-04-01 ENCOUNTER — Other Ambulatory Visit: Payer: Self-pay | Admitting: Obstetrics

## 2014-04-01 DIAGNOSIS — B9689 Other specified bacterial agents as the cause of diseases classified elsewhere: Secondary | ICD-10-CM

## 2014-04-01 DIAGNOSIS — N760 Acute vaginitis: Principal | ICD-10-CM

## 2014-04-01 DIAGNOSIS — B3731 Acute candidiasis of vulva and vagina: Secondary | ICD-10-CM

## 2014-04-01 DIAGNOSIS — B373 Candidiasis of vulva and vagina: Secondary | ICD-10-CM

## 2014-04-01 MED ORDER — FLUCONAZOLE 150 MG PO TABS: 150.0000 mg | ORAL_TABLET | Freq: Once | ORAL | Status: DC

## 2014-04-01 MED ORDER — TINIDAZOLE 500 MG PO TABS: 1000.0000 mg | ORAL_TABLET | Freq: Every day | ORAL | Status: DC

## 2014-04-08 ENCOUNTER — Emergency Department (HOSPITAL_BASED_OUTPATIENT_CLINIC_OR_DEPARTMENT_OTHER)
Admission: EM | Admit: 2014-04-08 | Discharge: 2014-04-08 | Disposition: A | Discharge status: Home / Self Care | Attending: Emergency Medicine | Admitting: Emergency Medicine

## 2014-04-08 ENCOUNTER — Telehealth: Payer: Self-pay | Admitting: *Deleted

## 2014-04-08 ENCOUNTER — Encounter (HOSPITAL_BASED_OUTPATIENT_CLINIC_OR_DEPARTMENT_OTHER): Payer: Self-pay | Admitting: *Deleted

## 2014-04-08 ENCOUNTER — Emergency Department (HOSPITAL_BASED_OUTPATIENT_CLINIC_OR_DEPARTMENT_OTHER)

## 2014-04-08 DIAGNOSIS — K5641 Fecal impaction: Secondary | ICD-10-CM

## 2014-04-08 DIAGNOSIS — R1030 Lower abdominal pain, unspecified: Secondary | ICD-10-CM | POA: Diagnosis present

## 2014-04-08 DIAGNOSIS — Z3202 Encounter for pregnancy test, result negative: Secondary | ICD-10-CM | POA: Diagnosis not present

## 2014-04-08 DIAGNOSIS — K6289 Other specified diseases of anus and rectum: Secondary | ICD-10-CM | POA: Diagnosis not present

## 2014-04-08 DIAGNOSIS — J45909 Unspecified asthma, uncomplicated: Secondary | ICD-10-CM | POA: Insufficient documentation

## 2014-04-08 DIAGNOSIS — Z79899 Other long term (current) drug therapy: Secondary | ICD-10-CM | POA: Diagnosis not present

## 2014-04-08 DIAGNOSIS — R195 Other fecal abnormalities: Secondary | ICD-10-CM | POA: Insufficient documentation

## 2014-04-08 DIAGNOSIS — Z87440 Personal history of urinary (tract) infections: Secondary | ICD-10-CM | POA: Diagnosis not present

## 2014-04-08 LAB — COMPREHENSIVE METABOLIC PANEL
ALBUMIN: 4.2 g/dL (ref 3.5–5.2)
ALT: 23 U/L (ref 0–35)
AST: 25 U/L (ref 0–37)
Alkaline Phosphatase: 56 U/L (ref 39–117)
Anion gap: 8 (ref 5–15)
BUN: 9 mg/dL (ref 6–23)
CHLORIDE: 101 mmol/L (ref 96–112)
CO2: 28 mmol/L (ref 19–32)
Calcium: 9.2 mg/dL (ref 8.4–10.5)
Creatinine, Ser: 0.86 mg/dL (ref 0.50–1.10)
GFR calc Af Amer: >90 mL/min (ref >90)
Glucose, Bld: 130 mg/dL — ABNORMAL HIGH (ref 70–99)
POTASSIUM: 3.2 mmol/L — AB (ref 3.5–5.1)
Sodium: 137 mmol/L (ref 135–145)
Total Bilirubin: 0.5 mg/dL (ref 0.3–1.2)
Total Protein: 6.9 g/dL (ref 6.0–8.3)

## 2014-04-08 LAB — CBC WITH DIFFERENTIAL/PLATELET
BASOS ABS: 0 10*3/uL (ref 0.0–0.1)
BASOS PCT: 0 % (ref 0–1)
Eosinophils Absolute: 0.1 10*3/uL (ref 0.0–0.7)
Eosinophils Relative: 2 % (ref 0–5)
HCT: 35.4 % — ABNORMAL LOW (ref 36.0–46.0)
HEMOGLOBIN: 11.9 g/dL — AB (ref 12.0–15.0)
Lymphocytes Relative: 30 % (ref 12–46)
Lymphs Abs: 1.5 10*3/uL (ref 0.7–4.0)
MCH: 28.1 pg (ref 26.0–34.0)
MCHC: 33.6 g/dL (ref 30.0–36.0)
MCV: 83.7 fL (ref 78.0–100.0)
MONOS PCT: 7 % (ref 3–12)
Monocytes Absolute: 0.3 10*3/uL (ref 0.1–1.0)
NEUTROS ABS: 3.1 10*3/uL (ref 1.7–7.7)
NEUTROS PCT: 61 % (ref 43–77)
Platelets: 292 10*3/uL (ref 150–400)
RBC: 4.23 MIL/uL (ref 3.87–5.11)
RDW: 11.6 % (ref 11.5–15.5)
WBC: 4.9 10*3/uL (ref 4.0–10.5)

## 2014-04-08 LAB — URINALYSIS, ROUTINE W REFLEX MICROSCOPIC
BILIRUBIN URINE: NEGATIVE
GLUCOSE, UA: NEGATIVE mg/dL
HGB URINE DIPSTICK: NEGATIVE
Ketones, ur: NEGATIVE mg/dL
LEUKOCYTES UA: NEGATIVE
Nitrite: NEGATIVE
PH: 6.5 (ref 5.0–8.0)
PROTEIN: NEGATIVE mg/dL
Specific Gravity, Urine: 1.01 (ref 1.005–1.030)
Urobilinogen, UA: 0.2 mg/dL (ref 0.0–1.0)

## 2014-04-08 LAB — PREGNANCY, URINE: PREG TEST UR: NEGATIVE

## 2014-04-08 LAB — OCCULT BLOOD X 1 CARD TO LAB, STOOL: Fecal Occult Bld: POSITIVE — AB

## 2014-04-08 MED ORDER — PEG 3350-KCL-NABCB-NACL-NASULF 236 G PO SOLR: 2.0000 L | Freq: Once | ORAL | Status: DC

## 2014-04-08 MED ORDER — IOHEXOL 300 MG/ML  SOLN
25.0000 mL | Freq: Once | INTRAMUSCULAR | Status: AC | PRN
Start: 2014-04-08 — End: 2014-04-08
  Administered 2014-04-08: 25 mL via ORAL

## 2014-04-08 MED ORDER — IOHEXOL 300 MG/ML  SOLN
100.0000 mL | Freq: Once | INTRAMUSCULAR | Status: AC | PRN
  Administered 2014-04-08: 100 mL via INTRAVENOUS

## 2014-04-08 NOTE — ED Notes (Signed)
Pt c/o LRQ abd pain since Dec, 2015 when she had a c-section. Pt also c/o increased pain during BM. Pt sts there is bright red blood present when she has a BM and her stool has been very hard.

## 2014-04-08 NOTE — Discharge Instructions (Signed)
Abdominal Pain, Women °Abdominal (stomach, pelvic, or belly) pain can be caused by many things. It is important to tell your doctor: °· The location of the pain. °· Does it come and go or is it present all the time? °· Are there things that start the pain (eating certain foods, exercise)? °· Are there other symptoms associated with the pain (fever, nausea, vomiting, diarrhea)? °All of this is helpful to know when trying to find the cause of the pain. °CAUSES  °· Stomach: virus or bacteria infection, or ulcer. °· Intestine: appendicitis (inflamed appendix), regional ileitis (Crohn's disease), ulcerative colitis (inflamed colon), irritable bowel syndrome, diverticulitis (inflamed diverticulum of the colon), or cancer of the stomach or intestine. °· Gallbladder disease or stones in the gallbladder. °· Kidney disease, kidney stones, or infection. °· Pancreas infection or cancer. °· Fibromyalgia (pain disorder). °· Diseases of the female organs: °· Uterus: fibroid (non-cancerous) tumors or infection. °· Fallopian tubes: infection or tubal pregnancy. °· Ovary: cysts or tumors. °· Pelvic adhesions (scar tissue). °· Endometriosis (uterus lining tissue growing in the pelvis and on the pelvic organs). °· Pelvic congestion syndrome (female organs filling up with blood just before the menstrual period). °· Pain with the menstrual period. °· Pain with ovulation (producing an egg). °· Pain with an IUD (intrauterine device, birth control) in the uterus. °· Cancer of the female organs. °· Functional pain (pain not caused by a disease, may improve without treatment). °· Psychological pain. °· Depression. °DIAGNOSIS  °Your doctor will decide the seriousness of your pain by doing an examination. °· Blood tests. °· X-rays. °· Ultrasound. °· CT scan (computed tomography, special type of X-ray). °· MRI (magnetic resonance imaging). °· Cultures, for infection. °· Barium enema (dye inserted in the large intestine, to better view it with  X-rays). °· Colonoscopy (looking in intestine with a lighted tube). °· Laparoscopy (minor surgery, looking in abdomen with a lighted tube). °· Major abdominal exploratory surgery (looking in abdomen with a large incision). °TREATMENT  °The treatment will depend on the cause of the pain.  °· Many cases can be observed and treated at home. °· Over-the-counter medicines recommended by your caregiver. °· Prescription medicine. °· Antibiotics, for infection. °· Birth control pills, for painful periods or for ovulation pain. °· Hormone treatment, for endometriosis. °· Nerve blocking injections. °· Physical therapy. °· Antidepressants. °· Counseling with a psychologist or psychiatrist. °· Minor or major surgery. °HOME CARE INSTRUCTIONS  °· Do not take laxatives, unless directed by your caregiver. °· Take over-the-counter pain medicine only if ordered by your caregiver. Do not take aspirin because it can cause an upset stomach or bleeding. °· Try a clear liquid diet (broth or water) as ordered by your caregiver. Slowly move to a bland diet, as tolerated, if the pain is related to the stomach or intestine. °· Have a thermometer and take your temperature several times a day, and record it. °· Bed rest and sleep, if it helps the pain. °· Avoid sexual intercourse, if it causes pain. °· Avoid stressful situations. °· Keep your follow-up appointments and tests, as your caregiver orders. °· If the pain does not go away with medicine or surgery, you may try: °· Acupuncture. °· Relaxation exercises (yoga, meditation). °· Group therapy. °· Counseling. °SEEK MEDICAL CARE IF:  °· You notice certain foods cause stomach pain. °· Your home care treatment is not helping your pain. °· You need stronger pain medicine. °· You want your IUD removed. °· You feel faint or   lightheaded.  You develop nausea and vomiting.  You develop a rash.  You are having side effects or an allergy to your medicine. SEEK IMMEDIATE MEDICAL CARE IF:   Your  pain does not go away or gets worse.  You have a fever.  Your pain is felt only in portions of the abdomen. The right side could possibly be appendicitis. The left lower portion of the abdomen could be colitis or diverticulitis.  You are passing blood in your stools (bright red or black tarry stools, with or without vomiting).  You have blood in your urine.  You develop chills, with or without a fever.  You pass out. MAKE SURE YOU:   Understand these instructions.  Will watch your condition.  Will get help right away if you are not doing well or get worse. Document Released: 10/28/2006 Document Revised: 05/17/2013 Document Reviewed: 11/17/2008 [facility] Patient Information 2015 [employer] [place]. This information is not intended to replace advice given to you by your health care provider. Make sure you discuss any questions you have with your health care provider.   Fecal Impaction A fecal impaction happens when there is a large, firm amount of stool (or feces) that cannot be passed. The impacted stool is usually in the rectum, which is the lowest part of the large bowel. The impacted stool can block the colon and cause significant problems. CAUSES  The longer stool stays in the rectum, the harder it gets. Anything that slows down your bowel movements can lead to fecal impaction, such as:  Constipation. This can be a long-standing (chronic) problem or can happen suddenly (acute).  Painful conditions of the rectum, such as hemorrhoids or anal fissures. The pain of these conditions can make you try to avoid having bowel movements.  Narcotic pain-relieving medicines, such as methadone, morphine, or codeine.  Not drinking enough fluids.  Inactivity and bed rest over long periods of time.  Diseases of the brain or nervous system that damage the nerves controlling the muscles of the intestines. SIGNS AND SYMPTOMS   Lack of normal bowel movements or changes in bowel patterns.  Sense  of fullness in the rectum but unable to pass stool.  Pain or cramps in the abdominal area (often after meals).  Thin, watery discharge from the rectum. DIAGNOSIS  Your health care provider may suspect that you have a fecal impaction based on your symptoms and a physical exam. This will include an exam of your rectum. Sometimes X-rays or lab testing may be needed to confirm the diagnosis and to be sure there are no other problems.  TREATMENT   Initially an impaction can be removed manually. Using a gloved finger, your health care provider can remove hard stool from your rectum.  Medicine is sometimes needed. A suppository or enema can be given in the rectum to soften the stool, which can stimulate a bowel movement. Medicines can also be given by mouth (orally).  Though rare, surgery may be needed if the colon has torn (perforated) due to blockage. HOME CARE INSTRUCTIONS   Develop regular bowel habits. This could include getting in the habit of having a bowel movement after your morning cup of coffee or after eating. Be sure to allow yourself enough time on the toilet.  Maintain a high-fiber diet.  Drink enough fluids to keep your urine clear or pale yellow as directed by your health care provider.  Exercise regularly.  If you begin to get constipated, increase the amount of fiber in your  diet. Eat plenty of fruits, vegetables, whole wheat breads, bran, oatmeal, and similar products.  Take natural fiber laxatives or other laxatives only as directed by your health care provider. SEEK MEDICAL CARE IF:   You have ongoing rectal pain.  You require enemas or suppositories more than twice a week.  You have rectal bleeding.  You have continued problems, or you develop abdominal pain.  You have thin, pencil-like stools. SEEK IMMEDIATE MEDICAL CARE IF:  You have black or tarry stools. MAKE SURE YOU:   Understand these instructions.  Will watch your condition.  Will get help right  away if you are not doing well or get worse. Document Released: 09/23/2003 Document Revised: 10/21/2012 Document Reviewed: 07/07/2012 [facility] Patient Information 2015 [place], [place]. This information is not intended to replace advice given to you by your health care provider. Make sure you discuss any questions you have with your health care provider.   Constipation Constipation is when a person has fewer than three bowel movements a week, has difficulty having a bowel movement, or has stools that are dry, hard, or larger than normal. As people grow older, constipation is more common. If you try to fix constipation with medicines that make you have a bowel movement (laxatives), the problem may get worse. Long-term laxative use may cause the muscles of the colon to become weak. A low-fiber diet, not taking in enough fluids, and taking certain medicines may make constipation worse.  CAUSES   Certain medicines, such as antidepressants, pain medicine, iron supplements, antacids, and water pills.   Certain diseases, such as diabetes, irritable bowel syndrome (IBS), thyroid disease, or depression.   Not drinking enough water.   Not eating enough fiber-rich foods.   Stress or travel.   Lack of physical activity or exercise.   Ignoring the urge to have a bowel movement.   Using laxatives too much.  SIGNS AND SYMPTOMS   Having fewer than three bowel movements a week.   Straining to have a bowel movement.   Having stools that are hard, dry, or larger than normal.   Feeling full or bloated.   Pain in the lower abdomen.   Not feeling relief after having a bowel movement.  DIAGNOSIS  Your health care provider will take a medical history and perform a physical exam. Further testing may be done for severe constipation. Some tests may include:  A barium enema X-ray to examine your rectum, colon, and, sometimes, your small intestine.   A sigmoidoscopy to examine your lower  colon.   A colonoscopy to examine your entire colon. TREATMENT  Treatment will depend on the severity of your constipation and what is causing it. Some dietary treatments include drinking more fluids and eating more fiber-rich foods. Lifestyle treatments may include regular exercise. If these diet and lifestyle recommendations do not help, your health care provider may recommend taking over-the-counter laxative medicines to help you have bowel movements. Prescription medicines may be prescribed if over-the-counter medicines do not work.  HOME CARE INSTRUCTIONS   Eat foods that have a lot of fiber, such as fruits, vegetables, whole grains, and beans.  Limit foods high in fat and processed sugars, such as french fries, hamburgers, cookies, candies, and soda.   A fiber supplement may be added to your diet if you cannot get enough fiber from foods.   Drink enough fluids to keep your urine clear or pale yellow.   Exercise regularly or as directed by your health care provider.   Go  to the restroom when you have the urge to go. Do not hold it.   Only take over-the-counter or prescription medicines as directed by your health care provider. Do not take other medicines for constipation without talking to your health care provider first.  SEEK IMMEDIATE MEDICAL CARE IF:   You have bright red blood in your stool.   Your constipation lasts for more than 4 days or gets worse.   You have abdominal or rectal pain.   You have thin, pencil-like stools.   You have unexplained weight loss. MAKE SURE YOU:   Understand these instructions.  Will watch your condition.  Will get help right away if you are not doing well or get worse. Document Released: 09/29/2003 Document Revised: 01/05/2013 Document Reviewed: 10/12/2012 [facility] Patient Information 2015 [place], [place]. This information is not intended to replace advice given to you by your health care provider. Make sure you discuss any  questions you have with your health care provider.

## 2014-04-08 NOTE — ED Provider Notes (Signed)
CSN: [id number]     Arrival date & time 04/08/14  1629 History   First MD Initiated Contact with Patient 04/08/14 1713     Chief Complaint  Patient presents with  . Abdominal Pain     (Consider location/radiation/quality/duration/timing/severity/associated sxs/prior Treatment) HPI 24 year old female presents with concern for lower abdominal pain since her C-section in December 2015. She states every day she been having pain in her right lower abdomen. It seems to be constant. Nothing seems to make it better. She has seen her OB/GYN if thinks she may have had a hernia does not make any referrals or other specific testing. The patient not had any nausea or vomiting. She's also been having hard stools and pain with having bowel movements. This is been gone for 2 months. During this time she also sees blood in her stool for the past 2 months. She states that she is fed up with having the symptoms that she came to the ER for evaluation. She's tried a stool softener, Maalox, fiber, and changing her diet with no relief. Has stools every 1-2 days. Denies any rectal discharge. No anal sex. No vaginal bleeding or discharge. No urinary symptoms.  Past Medical History  Diagnosis Date  . Asthma   . Seasonal allergies   . Positive PPD, treated   . Concussion   . Infection     UTI   Past Surgical History  Procedure Laterality Date  . Wisdom tooth extraction    . No past surgeries    . Cesarean section N/A 01/03/2014    Procedure: CESAREAN SECTION;  Surgeon: Antionette Char, MD;  Location: [facility];  Service: Obstetrics;  Laterality: N/A;   Family History  Problem Relation Age of Onset  . Adopted: Yes  . Family history unknown: Yes   History  Substance Use Topics  . Smoking status: Never Smoker   . Smokeless tobacco: Never Used  . Alcohol Use: 0.0 oz/week    0 Standard drinks or equivalent per week     Comment: Socially    OB History    Gravida Para Term Preterm AB TAB SAB Ectopic  Multiple Living   0 1     Review of Systems  Constitutional: Negative for fever and chills.  Gastrointestinal: Positive for abdominal pain, constipation, blood in stool and rectal pain. Negative for nausea and vomiting.  Genitourinary: Negative for dysuria, vaginal bleeding, vaginal discharge and menstrual problem.  Musculoskeletal: Negative for back pain.  All other systems reviewed and are negative.     Allergies  Review of patient's allergies indicates no known allergies.  Home Medications   Prior to Admission medications   Medication Sig Start Date End Date Taking? Authorizing Provider  albuterol (PROVENTIL HFA;VENTOLIN HFA) 108 (90 BASE) MCG/ACT inhaler Inhale 2 puffs into the lungs every 6 (six) hours as needed for wheezing or shortness of breath.    Historical Provider, MD  cetirizine (ZYRTEC) 10 MG tablet Take 10 mg by mouth daily.    Historical Provider, MD  fluconazole (DIFLUCAN) 150 MG tablet Take 1 tablet (150 mg total) by mouth once. 04/01/14   Brock Bad, MD  ibuprofen (ADVIL,MOTRIN) 600 MG tablet Take 1 tablet (600 mg total) by mouth every 6 (six) hours as needed for mild pain. Patient not taking: Reported on 03/28/2014 01/06/14   Brock Bad, MD  Iron-FA-B Cmp-C-Biot-Probiotic (FUSION PLUS) CAPS Take 1 capsule by mouth daily before breakfast. Patient not taking: Reported  on 03/28/2014 01/06/14   Brock Bad, MD  tinidazole (TINDAMAX) 500 MG tablet Take 2 tablets (1,000 mg total) by mouth daily with breakfast. 04/01/14   Brock Bad, MD   BP 123/75 mmHg  Pulse 95  Temp(Src) 98.2 F (36.8 C) (Oral)  Resp 18  Ht  (1.6 m)  Wt 135 lb (61.236 kg)  BMI 23.92 kg/m2  SpO2 98%  LMP 03/26/2014 (Approximate) Physical Exam  Constitutional: She is oriented to person, place, and time. She appears well-developed and well-nourished.  HENT:  Head: Normocephalic and atraumatic.  Right Ear: External ear normal.  Left Ear: External ear normal.   Nose: Nose normal.  Eyes: Right eye exhibits no discharge. Left eye exhibits no discharge.  Cardiovascular: Normal rate, regular rhythm and normal heart sounds.   Pulmonary/Chest: Effort normal and breath sounds normal.  Abdominal: Soft. There is tenderness in the right lower quadrant and left lower quadrant. Hernia confirmed negative in the right inguinal area and confirmed negative in the left inguinal area.  Genitourinary: Rectal exam shows tenderness. Rectal exam shows no external hemorrhoid and no fissure. Guaiac positive stool.  No obvious external abnormalities. Unable to do a rectal exam due to tenderness. Light pink blood on finger after minimal exam. No obvious masses  Neurological: She is alert and oriented to person, place, and time.  Skin: Skin is warm and dry.  Nursing note and vitals reviewed.   ED Course  Procedures (including critical care time) Labs Review Labs Reviewed  OCCULT BLOOD X 1 CARD TO LAB, STOOL - Abnormal; Notable for the following:    Fecal Occult Bld POSITIVE (*)    All other components within normal limits  CBC WITH DIFFERENTIAL/PLATELET - Abnormal; Notable for the following:    Hemoglobin 11.9 (*)    HCT 35.4 (*)    All other components within normal limits  COMPREHENSIVE METABOLIC PANEL - Abnormal; Notable for the following:    Potassium 3.2 (*)    Glucose, Bld 130 (*)    All other components within normal limits  URINALYSIS, ROUTINE W REFLEX MICROSCOPIC  PREGNANCY, URINE    Imaging Review Ct Abdomen Pelvis W Contrast  04/08/2014   CLINICAL DATA:  Right lower quadrant pain, rectal bleeding  EXAM: CT ABDOMEN AND PELVIS WITH CONTRAST  TECHNIQUE: Multidetector CT imaging of the abdomen and pelvis was performed using the standard protocol following bolus administration of intravenous contrast.  CONTRAST:  25mL OMNIPAQUE IOHEXOL 300 MG/ML SOLN, OMNIPAQUE IOHEXOL 300 MG/ML SOLN  COMPARISON:  CT scan 10/05/2011  FINDINGS: Lung bases are  unremarkable. Sagittal images of the spine are unremarkable.  Enhanced liver, pancreas, spleen and adrenal glands are unremarkable. Enhanced kidneys are symmetrical in size. No hydronephrosis or hydroureter. No aortic aneurysm.  No small bowel obstruction.  No ascites or free air.  No adenopathy.  There is no pericecal inflammation. The appendix is not clearly identified. The terminal ileum is unremarkable. A moderate distended urinary bladder is noted.  The uterus and adnexa are unremarkable. Moderate stool noted within rectum. The rectum is distended with stool up to 6.4 S cm suspicious for mild fecal impaction. No destructive bony lesions are noted within pelvis. There is no inguinal adenopathy.  No calcified gallstones are noted within gallbladder.  IMPRESSION: 1. No acute inflammatory process within abdomen. 2. No pericecal inflammation. The appendix is not clearly identified. The terminal ileum is unremarkable. 3. No hydronephrosis or hydroureter. 4. Moderate distended urinary bladder. Moderate stool noted within rectum  which measures 6.4 cm in diameter suspicious for mild fecal impaction.   Electronically Signed   By: Natasha MeadLiviu  Pop M.D.   On: 04/08/2014 20:02     EKG Interpretation None      MDM   Final diagnoses:  Lower abdominal pain  Fecal impaction    Given her rectal bleeding, a CT was obtained to rule out an inflammatory process such as inflammatory bowel disease. CT is unimpressive except for mild fecal impaction. I discussed this with the patient, she declines repeat rectal exam or manual disimpaction. We'll give her GoLYTELY to help clear her out. No acute process otherwise in the abdomen. Patient states she needs to urinate, the 6 planes are distended urinary bladder as she is able to urinate normally. At this point I will refer her back to her OB/GYN as well as a gastroenterology follow-up.    Pricilla LovelessScott Tris Howell, MD 04/09/14 [phone]

## 2014-04-13 NOTE — Telephone Encounter (Signed)
error 

## 2014-04-14 ENCOUNTER — Encounter: Payer: Self-pay | Admitting: Obstetrics

## 2014-04-14 ENCOUNTER — Ambulatory Visit (INDEPENDENT_AMBULATORY_CARE_PROVIDER_SITE_OTHER): Admitting: Obstetrics

## 2014-04-14 VITALS — BP 112/64 | HR 72 | Temp 98.7°F | Ht 63.0 in | Wt 133.0 lb

## 2014-04-14 DIAGNOSIS — Z30011 Encounter for initial prescription of contraceptive pills: Secondary | ICD-10-CM

## 2014-04-14 DIAGNOSIS — R52 Pain, unspecified: Secondary | ICD-10-CM | POA: Diagnosis not present

## 2014-04-14 DIAGNOSIS — K5904 Chronic idiopathic constipation: Secondary | ICD-10-CM

## 2014-04-14 DIAGNOSIS — K5909 Other constipation: Secondary | ICD-10-CM | POA: Diagnosis not present

## 2014-04-14 MED ORDER — DOCUSATE SODIUM 100 MG PO CAPS: 100.0000 mg | ORAL_CAPSULE | Freq: Two times a day (BID) | ORAL | Status: DC

## 2014-04-14 MED ORDER — IBUPROFEN 800 MG PO TABS: 800.0000 mg | ORAL_TABLET | Freq: Three times a day (TID) | ORAL | Status: DC | PRN

## 2014-04-14 MED ORDER — NORETHIN ACE-ETH ESTRAD-FE 1-20 MG-MCG(24) PO TABS: 1.0000 | ORAL_TABLET | Freq: Every day | ORAL | Status: DC

## 2014-04-14 MED ORDER — MAGNESIUM OXIDE -MG SUPPLEMENT 200 MG PO TABS: 1.0000 | ORAL_TABLET | Freq: Every day | ORAL | Status: DC

## 2014-04-15 ENCOUNTER — Encounter: Payer: Self-pay | Admitting: Obstetrics

## 2014-04-15 NOTE — Progress Notes (Signed)
Patient ID: Victoria Weber, female   DOB: [birth year], 24 y.o.   MRN: [id number]  Chief Complaint  Patient presents with  . Follow-up    Birth Control/ C-section pain     HPI Victoria Weber is a 24 y.o. female.  C/O constipation.  Wants OCP's for birth control.  HPI  Past Medical History  Diagnosis Date  . Asthma   . Seasonal allergies   . Positive PPD, treated   . Concussion   . Infection     UTI    Past Surgical History  Procedure Laterality Date  . Wisdom tooth extraction    . No past surgeries    . Cesarean section N/A 01/03/2014    Procedure: CESAREAN SECTION;  Surgeon: Antionette Char, MD;  Location: [facility];  Service: Obstetrics;  Laterality: N/A;    Family History  Problem Relation Age of Onset  . Adopted: Yes  . Family history unknown: Yes    Social History History  Substance Use Topics  . Smoking status: Never Smoker   . Smokeless tobacco: Never Used  . Alcohol Use: 0.0 oz/week    0 Standard drinks or equivalent per week     Comment: Socially     No Known Allergies  Current Outpatient Prescriptions  Medication Sig Dispense Refill  . albuterol (PROVENTIL HFA;VENTOLIN HFA) 108 (90 BASE) MCG/ACT inhaler Inhale 2 puffs into the lungs every 6 (six) hours as needed for wheezing or shortness of breath.    . cetirizine (ZYRTEC) 10 MG tablet Take 10 mg by mouth daily.    . fluconazole (DIFLUCAN) 150 MG tablet Take 1 tablet (150 mg total) by mouth once. 1 tablet 2  . ibuprofen (ADVIL,MOTRIN) 600 MG tablet Take 1 tablet (600 mg total) by mouth every 6 (six) hours as needed for mild pain. 30 tablet 5  . tinidazole (TINDAMAX) 500 MG tablet Take 2 tablets (1,000 mg total) by mouth daily with breakfast. 10 tablet 2  . docusate sodium (COLACE) 100 MG capsule Take 1 capsule (100 mg total) by mouth 2 (two) times daily. 30 capsule 11  . ibuprofen (ADVIL,MOTRIN) 800 MG tablet Take 1 tablet (800 mg total) by mouth every 8 (eight) hours as needed for mild pain or cramping. 30  tablet 5  . Magnesium Oxide 200 MG TABS Take 1 tablet (200 mg total) by mouth daily. 30 tablet 11  . Norethindrone Acetate-Ethinyl Estrad-FE (LOESTRIN 24 FE) 1-20 MG-MCG(24) tablet Take 1 tablet by mouth daily. 1 Package 11   No current facility-administered medications for this visit.    Review of Systems Review of Systems Constitutional: negative for fatigue and weight loss Respiratory: negative for cough and wheezing Cardiovascular: negative for chest pain, fatigue and palpitations Gastrointestinal: negative for abdominal pain and change in bowel habits Genitourinary:negative Integument/breast: negative for nipple discharge Musculoskeletal:negative for myalgias Neurological: negative for gait problems and tremors Behavioral/Psych: negative for abusive relationship, depression Endocrine: negative for temperature intolerance     Blood pressure 112/64, pulse 72, temperature 98.7 F (37.1 C), height  (1.6 m), weight 133 lb (60.328 kg), last menstrual period 03/26/2014, not currently breastfeeding.  Physical Exam Physical Exam:  Deferred  100% of 10 min visit spent on counseling and coordination of care.   Data Reviewed Labs  Assessment     Constipation Contraceptive Counseling     Plan    Constipation treatment and prevention discussed and recommendations made. Loestrin 24 Rx  No orders of the defined types were placed in this encounter.  Meds ordered this encounter  Medications  . Norethindrone Acetate-Ethinyl Estrad-FE (LOESTRIN 24 FE) 1-20 MG-MCG(24) tablet    Sig: Take 1 tablet by mouth daily.    Dispense:  1 Package    Refill:  11  . ibuprofen (ADVIL,MOTRIN) 800 MG tablet    Sig: Take 1 tablet (800 mg total) by mouth every 8 (eight) hours as needed for mild pain or cramping.    Dispense:  30 tablet    Refill:  5  . Magnesium Oxide 200 MG TABS    Sig: Take 1 tablet (200 mg total) by mouth daily.    Dispense:  30 tablet    Refill:  11  . docusate  sodium (COLACE) 100 MG capsule    Sig: Take 1 capsule (100 mg total) by mouth 2 (two) times daily.    Dispense:  30 capsule    Refill:  11

## 2014-08-04 ENCOUNTER — Encounter (HOSPITAL_COMMUNITY): Payer: Self-pay | Admitting: Emergency Medicine

## 2014-08-04 ENCOUNTER — Emergency Department (HOSPITAL_COMMUNITY)
Admission: EM | Admit: 2014-08-04 | Discharge: 2014-08-04 | Disposition: A | Discharge status: Home / Self Care | Payer: Medicaid Other | Attending: Emergency Medicine | Admitting: Emergency Medicine

## 2014-08-04 DIAGNOSIS — Z79899 Other long term (current) drug therapy: Secondary | ICD-10-CM | POA: Insufficient documentation

## 2014-08-04 DIAGNOSIS — Z87440 Personal history of urinary (tract) infections: Secondary | ICD-10-CM | POA: Insufficient documentation

## 2014-08-04 DIAGNOSIS — J45909 Unspecified asthma, uncomplicated: Secondary | ICD-10-CM | POA: Insufficient documentation

## 2014-08-04 DIAGNOSIS — H578 Other specified disorders of eye and adnexa: Secondary | ICD-10-CM | POA: Diagnosis present

## 2014-08-04 DIAGNOSIS — H109 Unspecified conjunctivitis: Secondary | ICD-10-CM

## 2014-08-04 MED ORDER — ERYTHROMYCIN 5 MG/GM OP OINT: TOPICAL_OINTMENT | OPHTHALMIC | Status: DC

## 2014-08-04 NOTE — Discharge Instructions (Signed)

## 2014-08-04 NOTE — ED Provider Notes (Signed)
CSN: [id number]     Arrival date & time 08/04/14  1601 History   First MD Initiated Contact with Patient 08/04/14 1603     Chief Complaint  Patient presents with  . Conjunctivitis     Patient is a 24 y.o. female presenting with conjunctivitis. The history is provided by the patient.  Conjunctivitis This is a new problem. The current episode started yesterday. The problem occurs daily. The problem has been gradually worsening. Nothing aggravates the symptoms. Nothing relieves the symptoms.  patient reports left eye redness/drainage for past day No fever/cough She reports child has had cough/congestion  Past Medical History  Diagnosis Date  . Asthma   . Seasonal allergies   . Positive PPD, treated   . Concussion   . Infection     UTI   Past Surgical History  Procedure Laterality Date  . Wisdom tooth extraction    . No past surgeries    . Cesarean section N/A 01/03/2014    Procedure: CESAREAN SECTION;  Surgeon: Antionette Char, MD;  Location: [facility];  Service: Obstetrics;  Laterality: N/A;   Family History  Problem Relation Age of Onset  . Adopted: Yes  . Family history unknown: Yes   History  Substance Use Topics  . Smoking status: Never Smoker   . Smokeless tobacco: Never Used  . Alcohol Use: 0.0 oz/week    0 Standard drinks or equivalent per week     Comment: Socially    OB History    Gravida Para Term Preterm AB TAB SAB Ectopic Multiple Living   0 1     Review of Systems  Constitutional: Negative for fever.  Eyes: Positive for discharge and redness.      Allergies  Review of patient's allergies indicates no known allergies.  Home Medications   Prior to Admission medications   Medication Sig Start Date End Date Taking? Authorizing Provider  albuterol (PROVENTIL HFA;VENTOLIN HFA) 108 (90 BASE) MCG/ACT inhaler Inhale 2 puffs into the lungs every 6 (six) hours as needed for wheezing or shortness of breath.    Historical Provider, MD   cetirizine (ZYRTEC) 10 MG tablet Take 10 mg by mouth daily.    Historical Provider, MD  erythromycin ophthalmic ointment Place a 1/2 inch ribbon of ointment into the lower eyelid BID for 7 days 08/04/14   Zadie Rhine, MD  ibuprofen (ADVIL,MOTRIN) 600 MG tablet Take 1 tablet (600 mg total) by mouth every 6 (six) hours as needed for mild pain. 01/06/14   Brock Bad, MD  ibuprofen (ADVIL,MOTRIN) 800 MG tablet Take 1 tablet (800 mg total) by mouth every 8 (eight) hours as needed for mild pain or cramping. 04/14/14   Brock Bad, MD  Magnesium Oxide 200 MG TABS Take 1 tablet (200 mg total) by mouth daily. 04/14/14   Brock Bad, MD  Norethindrone Acetate-Ethinyl Estrad-FE (LOESTRIN 24 FE) 1-20 MG-MCG(24) tablet Take 1 tablet by mouth daily. 04/14/14   Brock Bad, MD   BP 118/70 mmHg  Pulse 79  Temp(Src) 97.7 F (36.5 C) (Oral)  Resp 20  Wt 135 lb (61.236 kg)  SpO2 100%  LMP 08/03/2014  Breastfeeding? No Physical Exam CONSTITUTIONAL: Well developed/well nourished HEAD: Normocephalic/atraumatic EYES: EOMI/PERRL, mild conjunctival erythema to OS, small amt of yellowish discharge.   ENMT: Mucous membranes moist NECK: supple no meningeal signs CV: S1/S2 noted, no murmurs/rubs/gallops noted LUNGS: Lungs are clear to auscultation bilaterally, no apparent distress NEURO:  Pt is awake/alert/appropriate, moves all extremitiesx4.  No facial droop.   SKIN: warm, color normal PSYCH: no abnormalities of mood noted, alert and oriented to situation  ED Course  Procedures   MDM   Final diagnoses:  Conjunctivitis of left eye    Nursing notes including past medical history and social history reviewed and considered in documentation     Zadie Rhine, MD 08/04/14 1627

## 2014-08-04 NOTE — ED Notes (Signed)
Mother states her baby has been sick for 1 week and this morning she awoke and her oleft eye was red and oozing, she states it burns.

## 2014-09-08 IMAGING — US US OB FOLLOW-UP
1 series · 12 of 12 positions shown · non-contrast
Comparison: none

CLINICAL DATA: Abdominal cramping without vaginal bleeding.

EXAM:
LIMITED OBSTETRIC ULTRASOUND

[Series 1: us ob follow-up · 0.24mm/px · 12 of 12 slices shown]
[im 1/12]
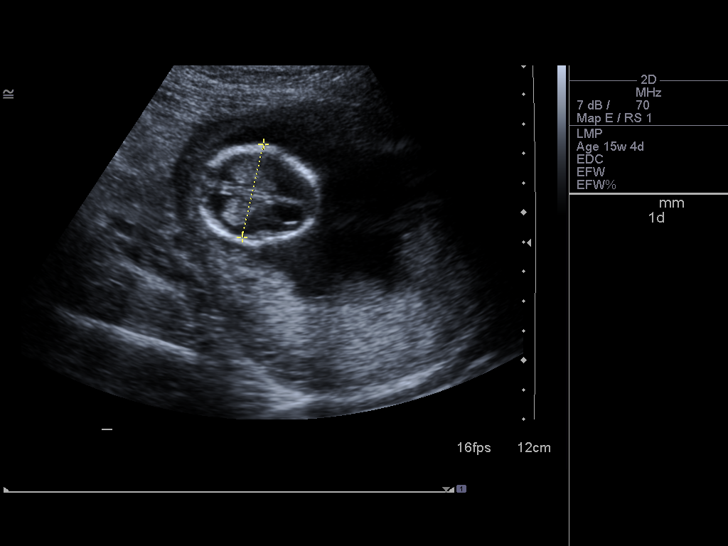
[im 2/12]
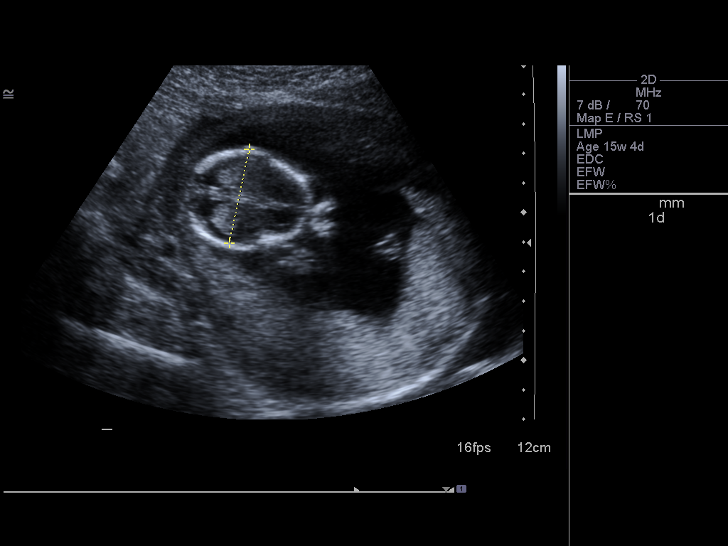
[im 3/12]
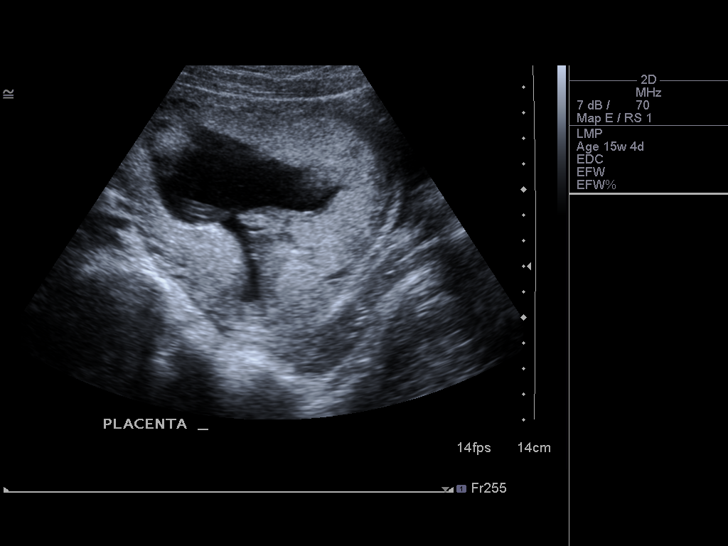
[im 4/12]
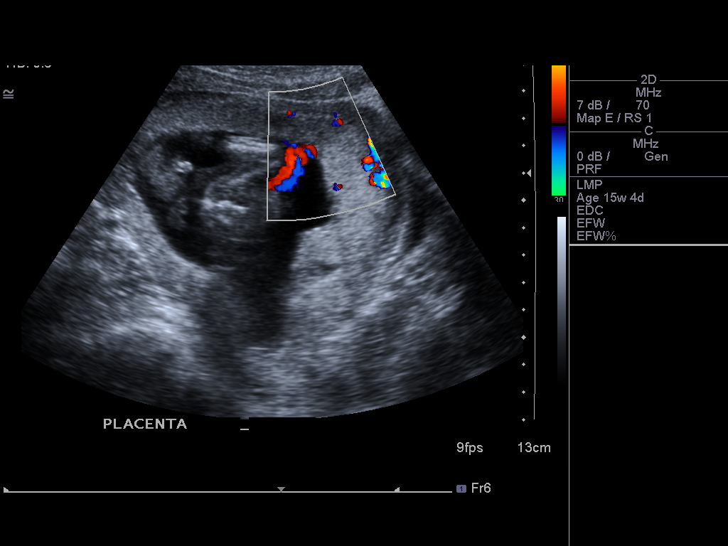
[im 5/12]
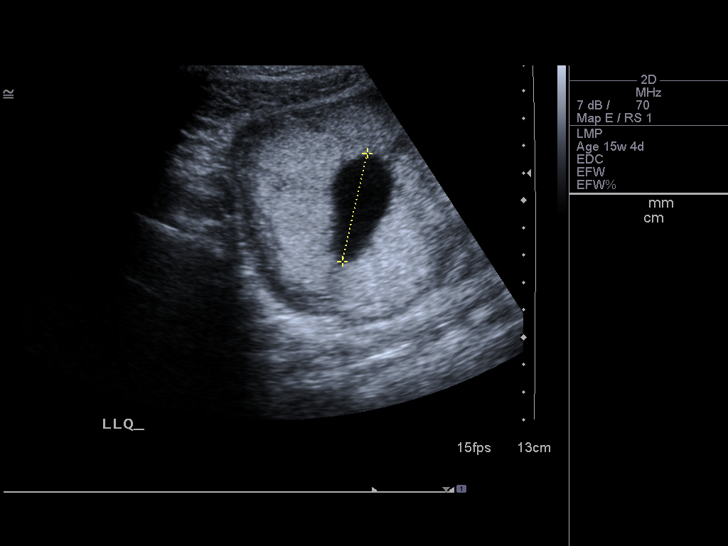
[im 6/12]
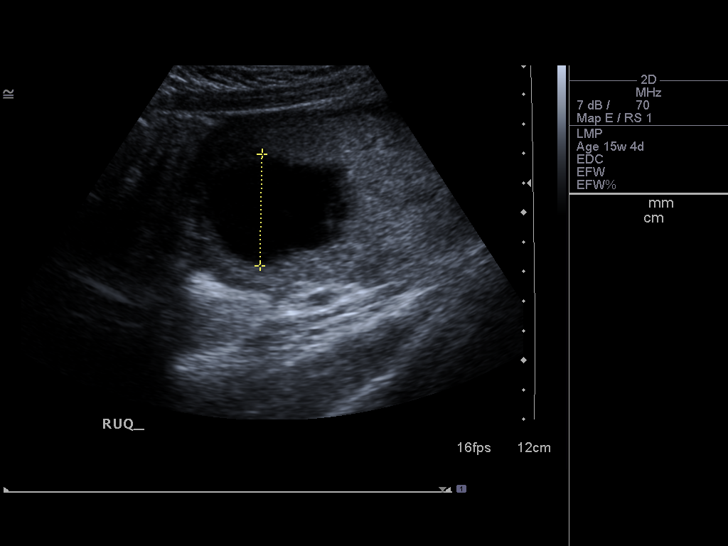
[im 7/12]
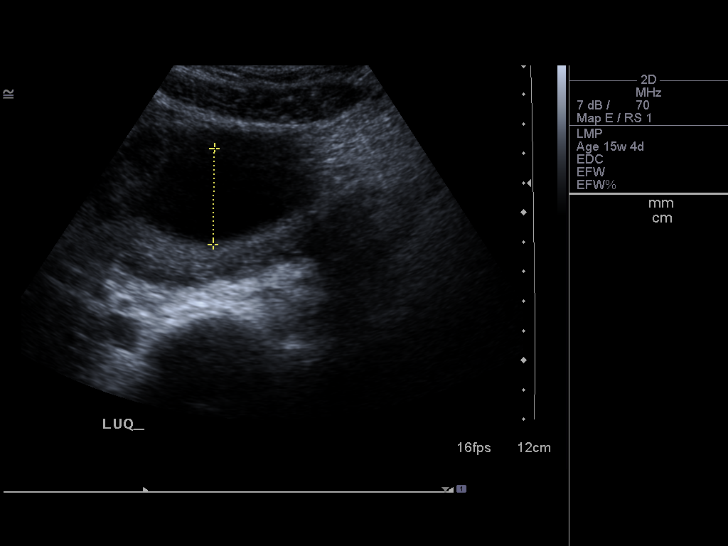
[im 8/12]
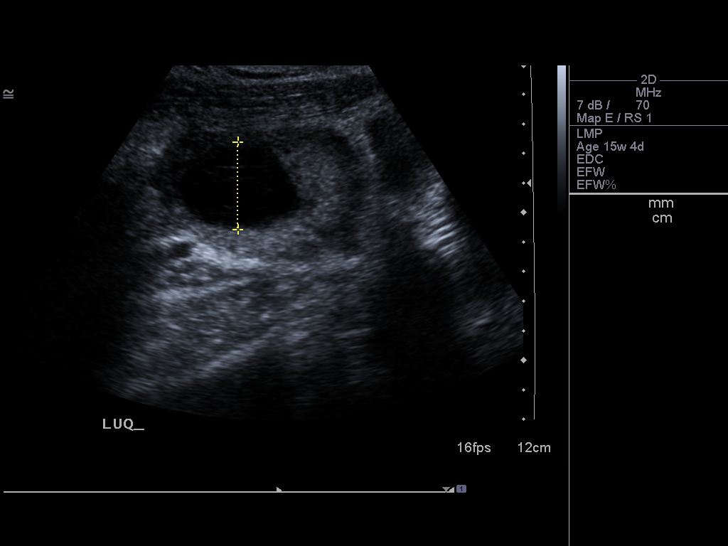
[im 9/12]
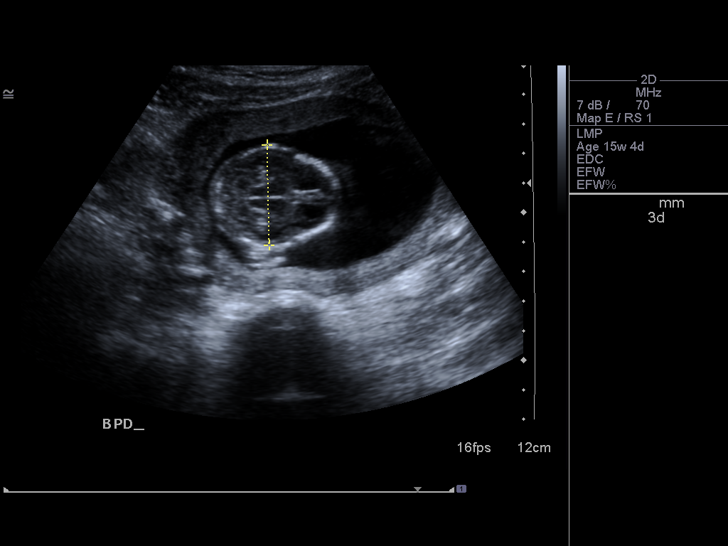
[im 10/12]
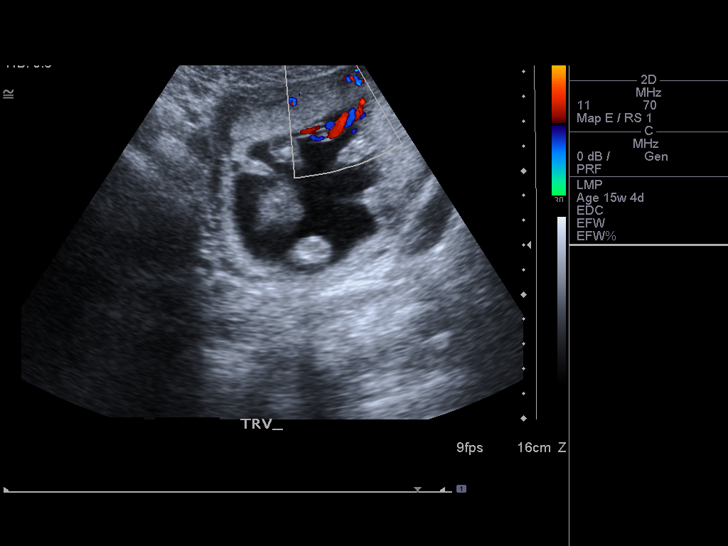
[im 11/12]
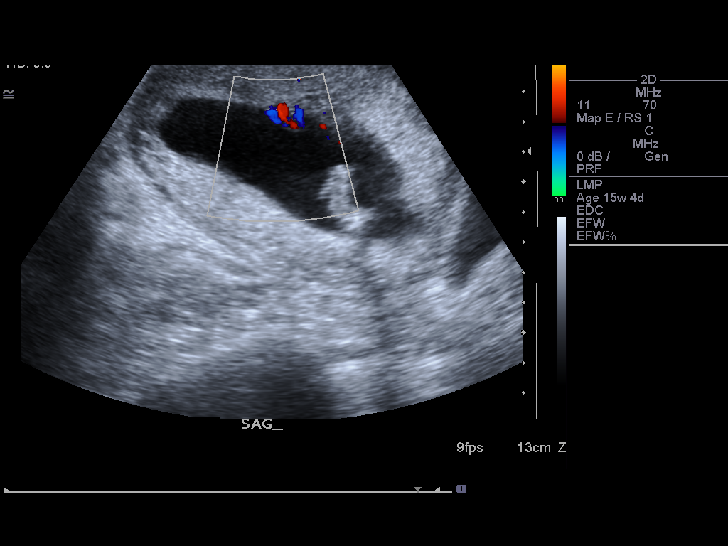
[im 12/12]
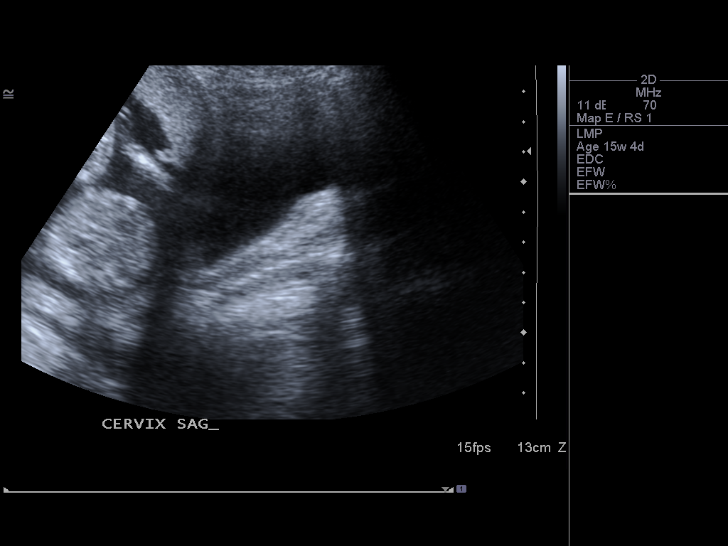

[12 of 12 positions shown; findings below may reference images not displayed]

FINDINGS: Number of Fetuses: 1

Cervix:  Appears closed.

Uterus/Adnexae: No abnormality visualized.

Heart Rate:  157 bpm

Movement: Yes per sonographer exam

Presentation: Cephalic

Placental Location: Anterior

Placenta Previa: There is shadowing over the internal cervical os,
but the placenta appears to have terminated well above the os.

Amniotic Fluid (Subjective): Normal

BPD:  3.29cm 16w   2d

Current Mean GA: 16w 2d                       US EDC: 12/26/2014

The cervix appears closed. The adnexae were not imaged. No evidence
of free pelvic fluid.
IMPRESSION: Single living intrauterine gestation, estimated age 16 weeks 2 days.
There has been normal growth since 06/03/2013 comparison.

This exam is performed on an emergent basis and does not
comprehensively evaluate fetal size, dating, or anatomy; follow-up
complete OB US should be considered if further fetal assessment is
warranted.

## 2014-09-13 ENCOUNTER — Ambulatory Visit: Admitting: Obstetrics

## 2014-09-26 ENCOUNTER — Ambulatory Visit: Admitting: Obstetrics

## 2014-10-31 ENCOUNTER — Telehealth: Payer: Self-pay | Admitting: *Deleted

## 2014-10-31 DIAGNOSIS — B9689 Other specified bacterial agents as the cause of diseases classified elsewhere: Secondary | ICD-10-CM

## 2014-10-31 DIAGNOSIS — N760 Acute vaginitis: Principal | ICD-10-CM

## 2014-10-31 MED ORDER — METRONIDAZOLE 500 MG PO TABS: 500.0000 mg | ORAL_TABLET | Freq: Two times a day (BID) | ORAL | Status: DC

## 2014-10-31 NOTE — Telephone Encounter (Signed)
Patient states she has BV and would like a refill of Flagyl for that. She has an appointment at the end of the month. 2:25 Rx sent to pharmacy- patient to follow up at her annual exam if no better.

## 2014-11-10 ENCOUNTER — Ambulatory Visit: Payer: Medicaid Other | Admitting: Obstetrics

## 2014-11-14 ENCOUNTER — Ambulatory Visit (INDEPENDENT_AMBULATORY_CARE_PROVIDER_SITE_OTHER): Payer: Medicaid Other | Admitting: Obstetrics

## 2014-12-05 ENCOUNTER — Encounter (HOSPITAL_BASED_OUTPATIENT_CLINIC_OR_DEPARTMENT_OTHER): Payer: Self-pay | Admitting: *Deleted

## 2014-12-05 ENCOUNTER — Emergency Department (HOSPITAL_BASED_OUTPATIENT_CLINIC_OR_DEPARTMENT_OTHER)
Admission: EM | Admit: 2014-12-05 | Discharge: 2014-12-05 | Disposition: A | Discharge status: Home / Self Care | Payer: Medicaid Other | Attending: Emergency Medicine | Admitting: Emergency Medicine

## 2014-12-05 DIAGNOSIS — R112 Nausea with vomiting, unspecified: Secondary | ICD-10-CM | POA: Diagnosis not present

## 2014-12-05 DIAGNOSIS — J45909 Unspecified asthma, uncomplicated: Secondary | ICD-10-CM | POA: Diagnosis not present

## 2014-12-05 DIAGNOSIS — Z87440 Personal history of urinary (tract) infections: Secondary | ICD-10-CM | POA: Diagnosis not present

## 2014-12-05 DIAGNOSIS — Z3202 Encounter for pregnancy test, result negative: Secondary | ICD-10-CM | POA: Insufficient documentation

## 2014-12-05 DIAGNOSIS — Z79899 Other long term (current) drug therapy: Secondary | ICD-10-CM | POA: Diagnosis not present

## 2014-12-05 DIAGNOSIS — R197 Diarrhea, unspecified: Secondary | ICD-10-CM | POA: Diagnosis not present

## 2014-12-05 DIAGNOSIS — R1084 Generalized abdominal pain: Secondary | ICD-10-CM | POA: Diagnosis not present

## 2014-12-05 DIAGNOSIS — N898 Other specified noninflammatory disorders of vagina: Secondary | ICD-10-CM | POA: Insufficient documentation

## 2014-12-05 DIAGNOSIS — R1110 Vomiting, unspecified: Secondary | ICD-10-CM

## 2014-12-05 LAB — PREGNANCY, URINE: Preg Test, Ur: NEGATIVE

## 2014-12-05 LAB — URINALYSIS, ROUTINE W REFLEX MICROSCOPIC
Bilirubin Urine: NEGATIVE
GLUCOSE, UA: NEGATIVE mg/dL
HGB URINE DIPSTICK: NEGATIVE
Ketones, ur: 15 mg/dL — AB
LEUKOCYTES UA: NEGATIVE
Nitrite: NEGATIVE
PROTEIN: NEGATIVE mg/dL
Specific Gravity, Urine: 1.017 (ref 1.005–1.030)
pH: 6.5 (ref 5.0–8.0)

## 2014-12-05 LAB — CBC WITH DIFFERENTIAL/PLATELET
BASOS PCT: 0 %
Basophils Absolute: 0 10*3/uL (ref 0.0–0.1)
Eosinophils Absolute: 0 10*3/uL (ref 0.0–0.7)
Eosinophils Relative: 0 %
HEMATOCRIT: 36.1 % (ref 36.0–46.0)
HEMOGLOBIN: 12 g/dL (ref 12.0–15.0)
LYMPHS PCT: 13 %
Lymphs Abs: 0.4 10*3/uL — ABNORMAL LOW (ref 0.7–4.0)
MCH: 26.3 pg (ref 26.0–34.0)
MCHC: 33.2 g/dL (ref 30.0–36.0)
MCV: 79.2 fL (ref 78.0–100.0)
MONOS PCT: 6 %
Monocytes Absolute: 0.2 10*3/uL (ref 0.1–1.0)
NEUTROS ABS: 2.4 10*3/uL (ref 1.7–7.7)
NEUTROS PCT: 81 %
Platelets: 216 10*3/uL (ref 150–400)
RBC: 4.56 MIL/uL (ref 3.87–5.11)
RDW: 12.9 % (ref 11.5–15.5)
WBC: 2.9 10*3/uL — ABNORMAL LOW (ref 4.0–10.5)

## 2014-12-05 LAB — BASIC METABOLIC PANEL
ANION GAP: 8 (ref 5–15)
BUN: 10 mg/dL (ref 6–20)
CHLORIDE: 103 mmol/L (ref 101–111)
CO2: 24 mmol/L (ref 22–32)
Calcium: 8.9 mg/dL (ref 8.9–10.3)
Creatinine, Ser: 0.67 mg/dL (ref 0.44–1.00)
GFR calc Af Amer: >60 mL/min (ref >60)
GFR calc non Af Amer: >60 mL/min (ref >60)
GLUCOSE: 98 mg/dL (ref 65–99)
Potassium: 3.8 mmol/L (ref 3.5–5.1)
Sodium: 135 mmol/L (ref 135–145)

## 2014-12-05 LAB — WET PREP, GENITAL
SPERM: NONE SEEN
Trich, Wet Prep: NONE SEEN
Yeast Wet Prep HPF POC: NONE SEEN

## 2014-12-05 MED ORDER — LIDOCAINE HCL (PF) 1 % IJ SOLN
INTRAMUSCULAR | Status: AC
  Administered 2014-12-05: 1.2 mL
  Filled 2014-12-05: qty 5

## 2014-12-05 MED ORDER — AZITHROMYCIN 250 MG PO TABS
1000.0000 mg | ORAL_TABLET | Freq: Once | ORAL | Status: AC
  Administered 2014-12-05: 1000 mg via ORAL
  Filled 2014-12-05: qty 4

## 2014-12-05 MED ORDER — CEFTRIAXONE SODIUM 250 MG IJ SOLR
250.0000 mg | Freq: Once | INTRAMUSCULAR | Status: AC
  Administered 2014-12-05: 250 mg via INTRAMUSCULAR
  Filled 2014-12-05: qty 250

## 2014-12-05 MED ORDER — ONDANSETRON 4 MG PO TBDP: ORAL_TABLET | ORAL | Status: DC

## 2014-12-05 NOTE — ED Provider Notes (Signed)
CSN: [id number]     Arrival date & time 12/05/14  1316 History  By signing my name below, I, Tanda Rockers, attest that this documentation has been prepared under the direction and in the presence of Mirian Mo, MD. Electronically Signed: Tanda Rockers, ED Scribe. 12/05/2014. 4:26 PM.  Chief Complaint  Patient presents with  . Abdominal Pain   Patient is a 24 y.o. female presenting with abdominal pain. The history is provided by the patient. No language interpreter was used.  Abdominal Pain Pain location:  Generalized Pain radiates to:  R flank Pain severity:  Moderate Onset quality:  Gradual Duration:  1 day Timing:  Constant Progression:  Unchanged Chronicity:  New Context: sick contacts   Context: not recent sexual activity   Relieved by:  None tried Worsened by:  Nothing tried Ineffective treatments:  None tried Associated symptoms: diarrhea, nausea, vaginal discharge and vomiting   Associated symptoms: no chills, no constipation, no fever and no vaginal bleeding      HPI Comments: Victoria Weber is a 24 y.o. female who presents to the Emergency Department complaining of gradual onset, constant, right sided abdominal pain radiating into right flank x 1 day. Pt also complains of nausea, vomiting, and diarrhea. She notes white colored vaginal discharge as well. Pt is not concerned for exposure to an STD. The last time she had intercourse was about 1 month ago. Denies fever, chills, vaginal bleeding, or any other associated symptoms. Recent sick contact with similar symptoms with son.   Past Medical History  Diagnosis Date  . Asthma   . Seasonal allergies   . Positive PPD, treated   . Concussion   . Infection     UTI   Past Surgical History  Procedure Laterality Date  . Wisdom tooth extraction    . No past surgeries    . Cesarean section N/A 01/03/2014    Procedure: CESAREAN SECTION;  Surgeon: Antionette Char, MD;  Location: [facility];  Service: Obstetrics;   Laterality: N/A;   Family History  Problem Relation Age of Onset  . Adopted: Yes  . Family history unknown: Yes   Social History  Substance Use Topics  . Smoking status: Never Smoker   . Smokeless tobacco: Never Used  . Alcohol Use: 0.0 oz/week    0 Standard drinks or equivalent per week     Comment: Socially    OB History    Gravida Para Term Preterm AB TAB SAB Ectopic Multiple Living   0 1     Review of Systems  Constitutional: Negative for fever and chills.  Gastrointestinal: Positive for nausea, vomiting, abdominal pain and diarrhea. Negative for constipation.  Genitourinary: Positive for flank pain and vaginal discharge. Negative for vaginal bleeding.  All other systems reviewed and are negative.   Allergies  Review of patient's allergies indicates no known allergies.  Home Medications   Prior to Admission medications   Medication Sig Start Date End Date Taking? Authorizing Provider  albuterol (PROVENTIL HFA;VENTOLIN HFA) 108 (90 BASE) MCG/ACT inhaler Inhale 2 puffs into the lungs every 6 (six) hours as needed for wheezing or shortness of breath.    Historical Provider, MD  cetirizine (ZYRTEC) 10 MG tablet Take 10 mg by mouth daily.    Historical Provider, MD  erythromycin ophthalmic ointment Place a 1/2 inch ribbon of ointment into the lower eyelid BID for 7 days 08/04/14   Zadie Rhine, MD  ibuprofen (ADVIL,MOTRIN) 600 MG tablet  Take 1 tablet (600 mg total) by mouth every 6 (six) hours as needed for mild pain. 01/06/14   Brock Bad, MD  ibuprofen (ADVIL,MOTRIN) 800 MG tablet Take 1 tablet (800 mg total) by mouth every 8 (eight) hours as needed for mild pain or cramping. 04/14/14   Brock Bad, MD  Magnesium Oxide 200 MG TABS Take 1 tablet (200 mg total) by mouth daily. 04/14/14   Brock Bad, MD  metroNIDAZOLE (FLAGYL) 500 MG tablet Take 1 tablet (500 mg total) by mouth 2 (two) times daily. 10/31/14   Brock Bad, MD  Norethindrone  Acetate-Ethinyl Estrad-FE (LOESTRIN 24 FE) 1-20 MG-MCG(24) tablet Take 1 tablet by mouth daily. 04/14/14   Brock Bad, MD  ondansetron (ZOFRAN ODT) 4 MG disintegrating tablet  ODT q4 hours prn nausea/vomit 12/05/14   Mirian Mo, MD   Triage Vitals: BP 127/65 mmHg  Pulse 90  Temp(Src) 98.7 F (37.1 C) (Oral)  Resp 18  Ht  (1.6 m)  Wt 133 lb (60.328 kg)  BMI 23.57 kg/m2  SpO2 100%  LMP 10/30/2014   Physical Exam  Constitutional: She is oriented to person, place, and time. She appears well-developed and well-nourished.  HENT:  Head: Normocephalic and atraumatic.  Right Ear: External ear normal.  Left Ear: External ear normal.  Eyes: Conjunctivae and EOM are normal. Pupils are equal, round, and reactive to light.  Neck: Normal range of motion. Neck supple.  Cardiovascular: Normal rate, regular rhythm, normal heart sounds and intact distal pulses.   Pulmonary/Chest: Effort normal and breath sounds normal.  Abdominal: Soft. Bowel sounds are normal. There is tenderness in the left upper quadrant and left lower quadrant.  LUQ>LLQ  Musculoskeletal: Normal range of motion.  Neurological: She is alert and oriented to person, place, and time.  Skin: Skin is warm and dry.  Vitals reviewed.   ED Course  Procedures (including critical care time)  DIAGNOSTIC STUDIES: Oxygen Saturation is 100% on RA, normal by my interpretation.    COORDINATION OF CARE: 4:24 PM-Discussed treatment plan which includes Rx Zofran andfollowing up with OBGYN if symptoms persist with pt at bedside and pt agreed to plan. Suggested pt have pelvic exam done in the ED but she declined.   Labs Review Labs Reviewed  URINALYSIS, ROUTINE W REFLEX MICROSCOPIC (NOT AT [facility]) - Abnormal; Notable for the following:    Ketones, ur 15 (*)    All other components within normal limits  CBC WITH DIFFERENTIAL/PLATELET - Abnormal; Notable for the following:    WBC 2.9 (*)    Lymphs Abs 0.4 (*)    All other  components within normal limits  PREGNANCY, URINE  BASIC METABOLIC PANEL    Imaging Review No results found. I have personally reviewed and evaluated these lab results as part of my medical decision-making.   EKG Interpretation None      MDM   Final diagnoses:  Vomiting and diarrhea  Generalized abdominal pain    24 y.o. female with pertinent PMH of prior UTI presents with n/v/d x 2 days with urinary frequency and urgency preceding the symptoms. On arrival today vitals signs and physical exam as above. Patient does have vaginal discharge and we discussed the utility of a pelvic exam and specifically addressed pelvic inflammatory disease would be possible complication of infertility and the patient refused pelvic exam. Workup as above otherwise unremarkable. Discharged home in stable condition with Zofran.    I have reviewed all laboratory and imaging studies  if ordered as above  1. Vomiting and diarrhea   2. Generalized abdominal pain           Mirian MoMatthew Gentry, MD 12/05/14 [phone]

## 2014-12-05 NOTE — ED Notes (Signed)
Right lower quadrant pain into her flank for a week. She feels like she may have a UTI.

## 2014-12-05 NOTE — Discharge Instructions (Signed)

## 2014-12-07 LAB — GC/CHLAMYDIA PROBE AMP (~~LOC~~) NOT AT ARMC
Chlamydia: NEGATIVE
NEISSERIA GONORRHEA: NEGATIVE

## 2014-12-22 ENCOUNTER — Ambulatory Visit: Payer: Medicaid Other | Admitting: Obstetrics

## 2015-04-07 ENCOUNTER — Telehealth: Payer: Self-pay | Admitting: *Deleted

## 2015-04-07 NOTE — Telephone Encounter (Signed)
Patient contacted the office requesting a prescription refill. Patient did not leave the name of medication she was needing refilled.  Attempted to contact the patient and left message for patient to call the office.

## 2015-04-12 NOTE — Telephone Encounter (Signed)
Patient is due an annual exam and pap. She has had numerous cancellations. No refills.

## 2015-06-23 ENCOUNTER — Emergency Department (HOSPITAL_BASED_OUTPATIENT_CLINIC_OR_DEPARTMENT_OTHER)
Admission: EM | Admit: 2015-06-23 | Discharge: 2015-06-23 | Disposition: A | Discharge status: Home / Self Care | Attending: Emergency Medicine | Admitting: Emergency Medicine

## 2015-06-23 ENCOUNTER — Encounter (HOSPITAL_BASED_OUTPATIENT_CLINIC_OR_DEPARTMENT_OTHER): Payer: Self-pay | Admitting: Emergency Medicine

## 2015-06-23 DIAGNOSIS — B9689 Other specified bacterial agents as the cause of diseases classified elsewhere: Secondary | ICD-10-CM

## 2015-06-23 DIAGNOSIS — J029 Acute pharyngitis, unspecified: Secondary | ICD-10-CM | POA: Insufficient documentation

## 2015-06-23 DIAGNOSIS — J45909 Unspecified asthma, uncomplicated: Secondary | ICD-10-CM | POA: Insufficient documentation

## 2015-06-23 DIAGNOSIS — N760 Acute vaginitis: Secondary | ICD-10-CM | POA: Insufficient documentation

## 2015-06-23 LAB — URINALYSIS, ROUTINE W REFLEX MICROSCOPIC
Bilirubin Urine: NEGATIVE
Glucose, UA: NEGATIVE mg/dL
Hgb urine dipstick: NEGATIVE
KETONES UR: 15 mg/dL — AB
LEUKOCYTES UA: NEGATIVE
NITRITE: NEGATIVE
PROTEIN: NEGATIVE mg/dL
Specific Gravity, Urine: 1.026 (ref 1.005–1.030)
pH: 6.5 (ref 5.0–8.0)

## 2015-06-23 LAB — WET PREP, GENITAL
SPERM: NONE SEEN
Trich, Wet Prep: NONE SEEN
YEAST WET PREP: NONE SEEN

## 2015-06-23 LAB — PREGNANCY, URINE: Preg Test, Ur: NEGATIVE

## 2015-06-23 LAB — RAPID STREP SCREEN (MED CTR MEBANE ONLY): Streptococcus, Group A Screen (Direct): NEGATIVE

## 2015-06-23 MED ORDER — METRONIDAZOLE 500 MG PO TABS: 500.0000 mg | ORAL_TABLET | Freq: Two times a day (BID) | ORAL | Status: DC

## 2015-06-23 MED ORDER — PENICILLIN G BENZATHINE 1200000 UNIT/2ML IM SUSP
1.2000 10*6.[IU] | Freq: Once | INTRAMUSCULAR | Status: AC
  Administered 2015-06-23: 1.2 10*6.[IU] via INTRAMUSCULAR
  Filled 2015-06-23: qty 2

## 2015-06-23 MED ORDER — DEXAMETHASONE SODIUM PHOSPHATE 10 MG/ML IJ SOLN
10.0000 mg | Freq: Once | INTRAMUSCULAR | Status: AC
  Administered 2015-06-23: 10 mg via INTRAMUSCULAR
  Filled 2015-06-23: qty 1

## 2015-06-23 MED FILL — metroNIDAZOLE 500 MG TABS: 500 | 7 days supply | Qty: 14 | Fill #0

## 2015-06-23 NOTE — ED Notes (Addendum)
Patient reports sore throat x 2 days.  Reports chills, headache since last night.  Patient also reports intermittent dysuria and vaginal discharge over the past 2 weeks.

## 2015-06-23 NOTE — ED Provider Notes (Signed)
CSN: [id number]     Arrival date & time 06/23/15  0940 History   First MD Initiated Contact with Patient 06/23/15 1024     Chief Complaint  Patient presents with  . Sore Throat  . Urinary Tract Infection     (Consider location/radiation/quality/duration/timing/severity/associated sxs/prior Treatment) HPI Comments: Victoria Weber is a 25 y.o. Female with history of asthma presents to ED with complaint of sore throat and vaginal discharge. Sore throat started approximately two days ago and has progressively worsened. She states it is painful to swallow and has trouble swallowing. She is managing her oral secretions. She has associated chills, headache, and tender left cervical lymph nodes. She denies sinus congestion, PND, nasal congestion, neck pain/stiffness, cough, or chest pain. She has tried cough drops, antihistamines, flonase, and tylenol with some relief of symptoms. No known sick contacts; however, her son is in daycare.   Patient also endorses a white, malodorous vaginal discharge with some lower abdominal pain. She denies vaginal pain or bleeding. No pelvic pain. No urinary symptoms. She is sexually active with one female partner and intermittently uses barrier contraception. History of STI - chlamydia, unsure if STI now.   Patient is a 25 y.o. female presenting with pharyngitis and urinary tract infection. The history is provided by the patient.  Sore Throat Associated symptoms include abdominal pain, chills, diaphoresis, headaches and a sore throat. Pertinent negatives include no chest pain, congestion, coughing, fatigue, fever, nausea, neck pain, rash or vomiting.  Urinary Tract Infection Associated symptoms: abdominal pain and vaginal discharge   Associated symptoms: no fever, no nausea and no vomiting     Past Medical History  Diagnosis Date  . Asthma   . Seasonal allergies   . Positive PPD, treated   . Concussion   . Infection     UTI   Past Surgical History  Procedure  Laterality Date  . Wisdom tooth extraction    . No past surgeries    . Cesarean section N/A 01/03/2014    Procedure: CESAREAN SECTION;  Surgeon: Antionette Char, MD;  Location: [facility];  Service: Obstetrics;  Laterality: N/A;   Family History  Problem Relation Age of Onset  . Adopted: Yes  . Family history unknown: Yes   Social History  Substance Use Topics  . Smoking status: Never Smoker   . Smokeless tobacco: Never Used  . Alcohol Use: 0.0 oz/week    0 Standard drinks or equivalent per week     Comment: Socially    OB History    Gravida Para Term Preterm AB TAB SAB Ectopic Multiple Living   1 1 1       0 1     Review of Systems  Constitutional: Positive for chills and diaphoresis. Negative for fever and fatigue.  HENT: Positive for sore throat and trouble swallowing. Negative for congestion, drooling, ear pain, postnasal drip, rhinorrhea and sinus pressure.   Eyes: Negative for discharge.  Respiratory: Negative for cough and wheezing.   Cardiovascular: Negative for chest pain.  Gastrointestinal: Positive for abdominal pain. Negative for nausea, vomiting and blood in stool.  Genitourinary: Positive for vaginal discharge. Negative for dysuria, hematuria, vaginal bleeding, vaginal pain and pelvic pain.  Musculoskeletal: Negative for neck pain and neck stiffness.  Skin: Negative for rash.  Neurological: Positive for headaches.      Allergies  Review of patient's allergies indicates no known allergies.  Home Medications   Prior to Admission medications   Medication Sig Start Date End Date Taking? Authorizing  Provider  albuterol (PROVENTIL HFA;VENTOLIN HFA) 108 (90 BASE) MCG/ACT inhaler Inhale 2 puffs into the lungs every 6 (six) hours as needed for wheezing or shortness of breath.    Historical Provider, MD  cetirizine (ZYRTEC) 10 MG tablet Take 10 mg by mouth daily.    Historical Provider, MD  erythromycin ophthalmic ointment Place a 1/2 inch ribbon of ointment into the  lower eyelid BID for 7 days 08/04/14   Zadie Rhineonald Wickline, MD  ibuprofen (ADVIL,MOTRIN) 600 MG tablet Take 1 tablet (600 mg total) by mouth every 6 (six) hours as needed for mild pain. 01/06/14   Brock Badharles A Harper, MD  ibuprofen (ADVIL,MOTRIN) 800 MG tablet Take 1 tablet (800 mg total) by mouth every 8 (eight) hours as needed for mild pain or cramping. 04/14/14   Brock Badharles A Harper, MD  Magnesium Oxide 200 MG TABS Take 1 tablet (200 mg total) by mouth daily. 04/14/14   Brock Badharles A Harper, MD  metroNIDAZOLE (FLAGYL) 500 MG tablet Take 1 tablet (500 mg total) by mouth 2 (two) times daily. 06/23/15   Lona KettleAshley Laurel Mayumi Summerson, PA-C  Norethindrone Acetate-Ethinyl Estrad-FE (LOESTRIN 24 FE) 1-20 MG-MCG(24) tablet Take 1 tablet by mouth daily. 04/14/14   Brock Badharles A Harper, MD  ondansetron (ZOFRAN ODT) 4 MG disintegrating tablet 4mg  ODT q4 hours prn nausea/vomit 12/05/14   Mirian MoMatthew Gentry, MD   BP 122/78 mmHg  Pulse 71  Temp(Src) 98 F (36.7 C) (Oral)  Resp 16  Ht 5\' 3"  (1.6 m)  Wt 63.504 kg  BMI 24.81 kg/m2  SpO2 100%  LMP 06/11/2015 (Approximate)  Breastfeeding? No Physical Exam  Constitutional: She appears well-developed and well-nourished. No distress.  HENT:  Head: Normocephalic and atraumatic.  Right Ear: External ear normal.  Left Ear: External ear normal.  Nose: Right sinus exhibits no maxillary sinus tenderness and no frontal sinus tenderness. Left sinus exhibits no maxillary sinus tenderness and no frontal sinus tenderness.  Mouth/Throat: Uvula is midline and mucous membranes are normal. No trismus in the jaw. No uvula swelling. Oropharyngeal exudate and posterior oropharyngeal erythema present. No posterior oropharyngeal edema or tonsillar abscesses.  Left sided tonsiller hypertrophy with exudate noted. Mild erythema of posterior pharynx. No soft palate swelling. Uvula is midline.   Eyes: Conjunctivae and EOM are normal. Pupils are equal, round, and reactive to light. Right eye exhibits no discharge. Left  eye exhibits no discharge. No scleral icterus.  Neck: Trachea normal, normal range of motion and phonation normal. Neck supple. No spinous process tenderness and no muscular tenderness present. No rigidity. Normal range of motion present.  Cardiovascular: Normal rate, regular rhythm, normal heart sounds and intact distal pulses.   No murmur heard. Pulmonary/Chest: Effort normal and breath sounds normal. No respiratory distress. She has no wheezes.  Abdominal: Soft. Normal appearance and bowel sounds are normal. There is tenderness ( suprapubic - mild). There is no rebound, no guarding and no CVA tenderness.  Genitourinary: Vagina normal. Pelvic exam was performed with patient supine. There is no tenderness, lesion or injury on the right labia. There is no tenderness, lesion or injury on the left labia. Cervix exhibits discharge. Cervix exhibits no motion tenderness and no friability.  Chaperone present for duration of exam. External genitalia normal in appearance, no masses, lesions, or ulcerations. No masses, lesions, or ulcerations noted in vaginal cavity. Cervix is closed with beige discharge noted. No bloody discharge. No friability. No CMT or masses palpated on bimanual.   Musculoskeletal: Normal range of motion.  Lymphadenopathy:  Mild TTP  of left anterior cervical lymph nodes.   Neurological: She is alert. Coordination normal.  Skin: Skin is warm and dry. She is not diaphoretic.  Psychiatric: She has a normal mood and affect. Her behavior is normal.    ED Course  Procedures (including critical care time) Labs Review Labs Reviewed  WET PREP, GENITAL - Abnormal; Notable for the following:    Clue Cells Wet Prep HPF POC PRESENT (*)    WBC, Wet Prep HPF POC MANY (*)    All other components within normal limits  URINALYSIS, ROUTINE W REFLEX MICROSCOPIC (NOT AT [facility]) - Abnormal; Notable for the following:    APPearance CLOUDY (*)    Ketones, ur 15 (*)    All other components within normal  limits  RAPID STREP SCREEN (NOT AT [facility])  CULTURE, GROUP A STREP ([facility])  PREGNANCY, URINE  GC/CHLAMYDIA PROBE AMP ([place]) NOT AT [facility]    Imaging Review No results found. I have personally reviewed and evaluated these images and lab results as part of my medical decision-making.   EKG Interpretation None      MDM   Final diagnoses:  Pharyngitis  Bacterial vaginosis   Pt  afebrile with tonsillar exudate, cervical lymphadenopathy, & dysphagia; Centor score 3. Rapid strep negative, will culture. Given Centor score will treat for strep. Treated in the Ed with steroids and PCN IM. Presentation non concerning for PTA or infxn spread to soft tissue. No trismus or uvula deviation. Specific return precautions discussed. Patient managing oral secretions in ED. Recommended PCP follow up.   Patient also experiencing vaginal discharge. Pregnancy test negative. U/A negative for UTI. Wet prep positive for clue cells. GC/Chlamydia pending. Will treat for bacterial vaginosis. Encouraged follow up with gynecologist. Discussed return precautions.   Patient voiced understanding and is agreeable.      Lona Kettle, PA-C 06/23/15 1354  Jerelyn Scott, MD 06/23/15 1359

## 2015-06-23 NOTE — Discharge Instructions (Signed)
Read the information below.   Your rapid strep was negative, we will send for culture. However, given symptoms we treated you in ED with antibiotics and steroids.  You also have bacterial vaginosis. You are being prescribed an antibiotic. Take full course even if feeling better.  Use the prescribed medication as directed.  Please discuss all new medications with your pharmacist.  Be sure to call and schedule a follow up appointment with your PCP or gynecologist in the next week  You may return to the Emergency Department at any time for worsening condition or any new symptoms that concern you. Return to ED if your symptoms worsen, you develop a fever, and inability to open your mouth, unable to manage your oral secretions, trouble swallowing, trouble breathing, inability to keep fluids down, focal abdominal pain, or blood in urine or blood in stool.    Pharyngitis Pharyngitis is a sore throat (pharynx). There is redness, pain, and swelling of your throat. HOME CARE   Drink enough fluids to keep your pee (urine) clear or pale yellow.  Only take medicine as told by your doctor.  You may get sick again if you do not take medicine as told. Finish your medicines, even if you start to feel better.  Do not take aspirin.  Rest.  Rinse your mouth (gargle) with salt water ( tsp of salt per 1 qt of water) every 1-2 hours. This will help the pain.  If you are not at risk for choking, you can suck on hard candy or sore throat lozenges. GET HELP IF:  You have large, tender lumps on your neck.  You have a rash.  You cough up green, yellow-brown, or bloody spit. GET HELP RIGHT AWAY IF:   You have a stiff neck.  You drool or cannot swallow liquids.  You throw up (vomit) or are not able to keep medicine or liquids down.  You have very bad pain that does not go away with medicine.  You have problems breathing (not from a stuffy nose). MAKE SURE YOU:   Understand these instructions.  Will  watch your condition.  Will get help right away if you are not doing well or get worse.   This information is not intended to replace advice given to you by your health care provider. Make sure you discuss any questions you have with your health care provider.   Document Released: 06/19/2007 Document Revised: 10/21/2012 Document Reviewed: 09/07/2012 Elsevier Interactive Patient Education 2016 Elsevier Inc.   Bacterial Vaginosis Bacterial vaginosis is an infection of the vagina. It happens when too many germs (bacteria) grow in the vagina. Having this infection puts you at risk for getting other infections from sex. Treating this infection can help lower your risk for other infections, such as:   Chlamydia.  Gonorrhea.  HIV.  Herpes. HOME CARE  Take your medicine as told by your doctor.  Finish your medicine even if you start to feel better.  Tell your sex partner that you have an infection. They should see their doctor for treatment.  During treatment:  Avoid sex or use condoms correctly.  Do not douche.  Do not drink alcohol unless your doctor tells you it is ok.  Do not breastfeed unless your doctor tells you it is ok. GET HELP IF:  You are not getting better after 3 days of treatment.  You have more grey fluid (discharge) coming from your vagina than before.  You have more pain than before.  You have  a fever. MAKE SURE YOU:   Understand these instructions.  Will watch your condition.  Will get help right away if you are not doing well or get worse.   This information is not intended to replace advice given to you by your health care provider. Make sure you discuss any questions you have with your health care provider.   Document Released: 10/10/2007 Document Revised: 01/21/2014 Document Reviewed: 08/12/2012 Elsevier Interactive Patient Education [employer] Elsevier Inc[employer]

## 2015-06-25 LAB — CULTURE, GROUP A STREP (THRC)

## 2015-06-26 LAB — GC/CHLAMYDIA PROBE AMP (~~LOC~~) NOT AT ARMC
CHLAMYDIA, DNA PROBE: NEGATIVE
NEISSERIA GONORRHEA: NEGATIVE

## 2015-07-25 ENCOUNTER — Ambulatory Visit (HOSPITAL_COMMUNITY)
Admission: EM | Admit: 2015-07-25 | Discharge: 2015-07-25 | Disposition: A | Discharge status: Home / Self Care | Attending: Family Medicine | Admitting: Family Medicine

## 2015-07-25 ENCOUNTER — Encounter (HOSPITAL_COMMUNITY): Payer: Self-pay | Admitting: Emergency Medicine

## 2015-07-25 DIAGNOSIS — N390 Urinary tract infection, site not specified: Secondary | ICD-10-CM | POA: Diagnosis not present

## 2015-07-25 LAB — POCT URINALYSIS DIP (DEVICE)
Bilirubin Urine: NEGATIVE
GLUCOSE, UA: NEGATIVE mg/dL
Ketones, ur: NEGATIVE mg/dL
NITRITE: POSITIVE — AB
SPECIFIC GRAVITY, URINE: 1.02 (ref 1.005–1.030)
UROBILINOGEN UA: 0.2 mg/dL (ref 0.0–1.0)
pH: 6.5 (ref 5.0–8.0)

## 2015-07-25 MED ORDER — FLUCONAZOLE 200 MG PO TABS: 200.0000 mg | ORAL_TABLET | Freq: Every day | ORAL | Status: AC

## 2015-07-25 MED ORDER — CEPHALEXIN 500 MG PO CAPS: 500.0000 mg | ORAL_CAPSULE | Freq: Four times a day (QID) | ORAL | Status: DC

## 2015-07-25 NOTE — ED Provider Notes (Signed)
CSN: [id number]     Arrival date & time 07/25/15  1924 History   First MD Initiated Contact with Patient 07/25/15 2031     Chief Complaint  Patient presents with  . Dysuria  . Urinary Frequency   (Consider location/radiation/quality/duration/timing/severity/associated sxs/prior Treatment) HPI History obtained from patient:  Pt presents with the cc of:  Dysuria Duration of symptoms: 2-3 weeks Treatment prior to arrival: None Context: Onset of dysuria a couple weeks ago but pain is worse today burning with urination Other symptoms include: Hematuria Pain score: 2 FAMILY HISTORY: Hypertension    Past Medical History  Diagnosis Date  . Asthma   . Seasonal allergies   . Positive PPD, treated   . Concussion   . Infection     UTI   Past Surgical History  Procedure Laterality Date  . Wisdom tooth extraction    . No past surgeries    . Cesarean section N/A 01/03/2014    Procedure: CESAREAN SECTION;  Surgeon: Antionette CharLisa Jackson-Moore, MD;  Location: [facility];  Service: Obstetrics;  Laterality: N/A;   Family History  Problem Relation Age of Onset  . Adopted: Yes  . Family history unknown: Yes   Social History  Substance Use Topics  . Smoking status: Never Smoker   . Smokeless tobacco: Never Used  . Alcohol Use: 0.0 oz/week    0 Standard drinks or equivalent per week     Comment: Socially    OB History    Gravida Para Term Preterm AB TAB SAB Ectopic Multiple Living   1 1 1       0 1     Review of Systems  Denies: HEADACHE, NAUSEA, ABDOMINAL PAIN, CHEST PAIN, CONGESTION,   SHORTNESS OF BREATH  Allergies  Review of patient's allergies indicates no known allergies.  Home Medications   Prior to Admission medications   Medication Sig Start Date End Date Taking? Authorizing Provider  albuterol (PROVENTIL HFA;VENTOLIN HFA) 108 (90 BASE) MCG/ACT inhaler Inhale 2 puffs into the lungs every 6 (six) hours as needed for wheezing or shortness of breath.   Yes Historical Provider, MD   cephALEXin (KEFLEX) 500 MG capsule Take 1 capsule (500 mg total) by mouth 4 (four) times daily. 07/25/15   Tharon AquasFrank C Patrick, PA  cetirizine (ZYRTEC) 10 MG tablet Take 10 mg by mouth daily.    Historical Provider, MD  erythromycin ophthalmic ointment Place a 1/2 inch ribbon of ointment into the lower eyelid BID for 7 days 08/04/14   Zadie Rhineonald Wickline, MD  fluconazole (DIFLUCAN) 200 MG tablet Take 1 tablet (200 mg total) by mouth daily. 07/25/15 08/01/15  Tharon AquasFrank C Patrick, PA  ibuprofen (ADVIL,MOTRIN) 600 MG tablet Take 1 tablet (600 mg total) by mouth every 6 (six) hours as needed for mild pain. 01/06/14   Brock Badharles A Harper, MD  ibuprofen (ADVIL,MOTRIN) 800 MG tablet Take 1 tablet (800 mg total) by mouth every 8 (eight) hours as needed for mild pain or cramping. 04/14/14   Brock Badharles A Harper, MD  Magnesium Oxide 200 MG TABS Take 1 tablet (200 mg total) by mouth daily. 04/14/14   Brock Badharles A Harper, MD  metroNIDAZOLE (FLAGYL) 500 MG tablet Take 1 tablet (500 mg total) by mouth 2 (two) times daily. 06/23/15   Lona KettleAshley Laurel Meyer, PA-C  Norethindrone Acetate-Ethinyl Estrad-FE (LOESTRIN 24 FE) 1-20 MG-MCG(24) tablet Take 1 tablet by mouth daily. 04/14/14   Brock Badharles A Harper, MD  ondansetron (ZOFRAN ODT) 4 MG disintegrating tablet 4mg  ODT q4 hours prn nausea/vomit 12/05/14  Mirian Mo, MD   Meds Ordered and Administered this Visit  Medications - No data to display  BP 127/74 mmHg  Pulse 70  Temp(Src) 98.5 F (36.9 C) (Oral)  Resp 18  SpO2 100%  LMP 07/06/2015 (Exact Date) No data found.   Physical Exam NURSES NOTES AND VITAL SIGNS REVIEWED. CONSTITUTIONAL: Well developed, well nourished, no acute distress HEENT: normocephalic, atraumatic EYES: Conjunctiva normal NECK:normal ROM, supple, no adenopathy PULMONARY:No respiratory distress, normal effort ABDOMINAL: Soft, ND, NT BS+, No CVAT MUSCULOSKELETAL: Normal ROM of all extremities,  SKIN: warm and dry without rash PSYCHIATRIC: Mood and affect,  behavior are normal  ED Course  Procedures (including critical care time)  Labs Review Labs Reviewed  POCT URINALYSIS DIP (DEVICE) - Abnormal; Notable for the following:    Hgb urine dipstick MODERATE (*)    Protein, ur >=300 (*)    Nitrite POSITIVE (*)    Leukocytes, UA LARGE (*)    All other components within normal limits    Imaging Review No results found.   Visual Acuity Review  Right Eye Distance:   Left Eye Distance:   Bilateral Distance:    Right Eye Near:   Left Eye Near:    Bilateral Near:         MDM   1. UTI (lower urinary tract infection)     Patient is reassured that there are no issues that require transfer to higher level of care at this time or additional tests. Patient is advised to continue home symptomatic treatment. Patient is advised that if there are new or worsening symptoms to attend the emergency department, contact primary care provider, or return to UC. Instructions of care provided discharged home in stable condition.    THIS NOTE WAS GENERATED USING A VOICE RECOGNITION SOFTWARE PROGRAM. ALL REASONABLE EFFORTS  WERE MADE TO PROOFREAD THIS DOCUMENT FOR ACCURACY.  I have verbally reviewed the discharge instructions with the patient. A printed AVS was given to the patient.  All questions were answered prior to discharge.      Tharon Aquas, PA 07/25/15 2109

## 2015-07-25 NOTE — Discharge Instructions (Signed)

## 2015-07-25 NOTE — ED Notes (Signed)
[facility] a [facility] of urinary frequency, hematuria and dysuria x 3 weeks.

## 2016-06-19 ENCOUNTER — Ambulatory Visit (HOSPITAL_COMMUNITY)
Admission: EM | Admit: 2016-06-19 | Discharge: 2016-06-19 | Disposition: A | Discharge status: Home / Self Care | Payer: BLUE CROSS/BLUE SHIELD | Attending: Internal Medicine | Admitting: Internal Medicine

## 2016-06-19 ENCOUNTER — Encounter (HOSPITAL_COMMUNITY): Payer: Self-pay | Admitting: *Deleted

## 2016-06-19 DIAGNOSIS — N898 Other specified noninflammatory disorders of vagina: Secondary | ICD-10-CM | POA: Diagnosis not present

## 2016-06-19 DIAGNOSIS — R102 Pelvic and perineal pain: Secondary | ICD-10-CM | POA: Diagnosis not present

## 2016-06-19 MED ORDER — METRONIDAZOLE 500 MG PO TABS: 500.0000 mg | ORAL_TABLET | Freq: Two times a day (BID) | ORAL | 0 refills | Status: DC

## 2016-06-19 NOTE — ED Triage Notes (Signed)
Pt  Reports   Low   abd  Pain  As   Well  As  Vaginal  Discharge     For  About  1   Week   She  Reports  Had  bv   About  1  Month  Ago  And  She  Has     Been  abstinate

## 2016-06-19 NOTE — ED Notes (Signed)
Pt  Was   Advised  That   Victoria Rasmussenavid  Mabe  FNP  Felt  That  She  Needed  A  Pelvic  Pt   Refused  Pt   Was  Advised  The  Risk  Involved   For  Possible  PID   She  Verbalized  She  Did    She   Was    Advised     To  Go  To  [facility]  If   Symptoms   Severe

## 2016-06-19 NOTE — ED Provider Notes (Signed)
CSN: [id number]     Arrival date & time 06/19/16  1512 History   First MD Initiated Contact with Patient 06/19/16 1636     Chief Complaint  Patient presents with  . Abdominal Pain   (Consider location/radiation/quality/duration/timing/severity/associated sxs/prior Treatment) 26 year old female complaining of pelvic pain with vaginal discharge for the past several days. It is worse when ambulating. She states she had same symptoms about a month ago was seen in an urgent care, provided a clean-catch urine and one week later was told that the urine was negative for STDs. She is requesting treatment for BV only. She refuses to have a pelvic exam or additional testing. She states she has an appointment with her PCP in one week.      Past Medical History:  Diagnosis Date  . Asthma   . Concussion   . Infection    UTI  . Positive PPD, treated   . Seasonal allergies    Past Surgical History:  Procedure Laterality Date  . CESAREAN SECTION N/A 01/03/2014   Procedure: CESAREAN SECTION;  Surgeon: Antionette Char, MD;  Location: [facility];  Service: Obstetrics;  Laterality: N/A;  . NO PAST SURGERIES    . WISDOM TOOTH EXTRACTION     Family History  Problem Relation Age of Onset  . Adopted: Yes  . Family history unknown: Yes   Social History  Substance Use Topics  . Smoking status: Never Smoker  . Smokeless tobacco: Never Used  . Alcohol use 0.0 oz/week     Comment: Socially    OB History    Gravida Para Term Preterm AB Living   1 1 1     1    SAB TAB Ectopic Multiple Live Births         0 1     Review of Systems  Constitutional: Negative.   Respiratory: Negative.   Genitourinary: Positive for pelvic pain and vaginal discharge. Negative for dysuria and frequency.  All other systems reviewed and are negative.   Allergies  Patient has no known allergies.  Home Medications   Prior to Admission medications   Medication Sig Start Date End Date Taking? Authorizing Provider   albuterol (PROVENTIL HFA;VENTOLIN HFA) 108 (90 BASE) MCG/ACT inhaler Inhale 2 puffs into the lungs every 6 (six) hours as needed for wheezing or shortness of breath.    [provider]  cephALEXin (KEFLEX) 500 MG capsule Take 1 capsule (500 mg total) by mouth 4 (four) times daily. 07/25/15   Tharon Aquas, PA  cetirizine (ZYRTEC) 10 MG tablet Take 10 mg by mouth daily.    [provider]  erythromycin ophthalmic ointment Place a 1/2 inch ribbon of ointment into the lower eyelid BID for 7 days 08/04/14   Zadie Rhine, MD  ibuprofen (ADVIL,MOTRIN) 600 MG tablet Take 1 tablet (600 mg total) by mouth every 6 (six) hours as needed for mild pain. 01/06/14   Brock Bad, MD  ibuprofen (ADVIL,MOTRIN) 800 MG tablet Take 1 tablet (800 mg total) by mouth every 8 (eight) hours as needed for mild pain or cramping. 04/14/14   Brock Bad, MD  Magnesium Oxide 200 MG TABS Take 1 tablet (200 mg total) by mouth daily. 04/14/14   Brock Bad, MD  metroNIDAZOLE (FLAGYL) 500 MG tablet Take 1 tablet (500 mg total) by mouth 2 (two) times daily. X 7 days 06/19/16   Hayden Rasmussen, NP  Norethindrone Acetate-Ethinyl Estrad-FE (LOESTRIN 24 FE) 1-20 MG-MCG(24) tablet Take 1 tablet by  mouth daily. 04/14/14   Brock BadHarper, Charles A, MD  ondansetron (ZOFRAN ODT) 4 MG disintegrating tablet 4mg  ODT q4 hours prn nausea/vomit 12/05/14   Mirian MoGentry, Matthew, MD   Meds Ordered and Administered this Visit  Medications - No data to display  BP 120/70 (BP Location: Right Arm)   Pulse 78   Temp 98.6 F (37 C) (Oral)   Resp 18   LMP 06/06/2016   SpO2 100%  No data found.   Physical Exam  Constitutional: She is oriented to person, place, and time. She appears well-developed and well-nourished. No distress.  Neck: Neck supple.  Cardiovascular: Normal rate.   Pulmonary/Chest: Effort normal.  Neurological: She is alert and oriented to person, place, and time.  Skin: Skin is warm and dry.  Psychiatric: She  has a normal mood and affect.  Nursing note and vitals reviewed.   Urgent Care Course     Procedures (including critical care time)  Labs Review Labs Reviewed - No data to display  Imaging Review No results found.   Visual Acuity Review  Right Eye Distance:   Left Eye Distance:   Bilateral Distance:    Right Eye Near:   Left Eye Near:    Bilateral Near:         MDM   1. Pelvic pain in female   2. Vaginal discharge    You are being treated for a presumed infection of bacterial vaginosis. Take the Flagyl as directed. Your symptoms of persistent pelvic pain and tenderness with vaginal discharge suggests a condition called PID or pelvic inflammatory disease. Read the instructions regarding this disease accompanying her papers. Serious conditions sometimes life-threatening may result from lack of diagnosis and treatment. It is recommended that a pelvic exam be performed today and swabs for testing obtained. The physical exam can suggest certain diseases or infections and treated on the spot. If You are getting worse, increased pain, fever, chills and feeling sickly or worsening of any kind you should be seen promptly at the emergency department. At your request no further testing or pelvic exam is being performed and you are provided with the medication Flagyl which only treats bacterial vaginosis and not other diseases which can cause her symptoms. Meds ordered this encounter  Medications  . metroNIDAZOLE (FLAGYL) 500 MG tablet    Sig: Take 1 tablet (500 mg total) by mouth 2 (two) times daily. X 7 days    Dispense:  14 tablet    Refill:  0    Order Specific Question:   Supervising Provider    Answer:   Eustace MooreMURRAY, LAURA W [[id number]]       Hayden RasmussenMabe, Kam Rahimi, NP 06/19/16 1657

## 2016-06-19 NOTE — Discharge Instructions (Signed)
You are being treated for a presumed infection of bacterial vaginosis. Take the Flagyl as directed. Your symptoms of persistent pelvic pain and tenderness with vaginal discharge suggests a condition called PID or pelvic inflammatory disease. Read the instructions regarding this disease accompanying her papers. Serious conditions sometimes life-threatening may result from lack of diagnosis and treatment. It is recommended that a pelvic exam be performed today and swabs for testing obtained. The physical exam can suggest certain diseases or infections and treated on the spot. If You are getting worse, increased pain, fever, chills and feeling sickly or worsening of any kind you should be seen promptly at the emergency department. At your request no further testing or pelvic exam is being performed and you are provided with the medication Flagyl which only treats bacterial vaginosis and not other diseases which can cause her symptoms.

## 2017-01-30 ENCOUNTER — Inpatient Hospital Stay (HOSPITAL_COMMUNITY): Payer: BC Managed Care – PPO

## 2017-01-30 ENCOUNTER — Encounter (HOSPITAL_COMMUNITY): Payer: Self-pay | Admitting: Student

## 2017-01-30 ENCOUNTER — Inpatient Hospital Stay (HOSPITAL_COMMUNITY)
Admission: AD | Admit: 2017-01-30 | Discharge: 2017-01-30 | Disposition: A | Discharge status: Home / Self Care | Payer: BC Managed Care – PPO | Source: Ambulatory Visit | Attending: Obstetrics & Gynecology | Admitting: Obstetrics & Gynecology

## 2017-01-30 DIAGNOSIS — Z79899 Other long term (current) drug therapy: Secondary | ICD-10-CM | POA: Diagnosis not present

## 2017-01-30 DIAGNOSIS — R109 Unspecified abdominal pain: Secondary | ICD-10-CM | POA: Diagnosis not present

## 2017-01-30 DIAGNOSIS — Z9889 Other specified postprocedural states: Secondary | ICD-10-CM

## 2017-01-30 DIAGNOSIS — J45909 Unspecified asthma, uncomplicated: Secondary | ICD-10-CM | POA: Insufficient documentation

## 2017-01-30 LAB — URINALYSIS, ROUTINE W REFLEX MICROSCOPIC
BACTERIA UA: NONE SEEN
Bilirubin Urine: NEGATIVE
Glucose, UA: NEGATIVE mg/dL
KETONES UR: NEGATIVE mg/dL
Leukocytes, UA: NEGATIVE
Nitrite: NEGATIVE
PROTEIN: NEGATIVE mg/dL
Specific Gravity, Urine: 1.025 (ref 1.005–1.030)
pH: 5 (ref 5.0–8.0)

## 2017-01-30 LAB — CBC
HEMATOCRIT: 30.2 % — AB (ref 36.0–46.0)
Hemoglobin: 10.3 g/dL — ABNORMAL LOW (ref 12.0–15.0)
MCH: 28.3 pg (ref 26.0–34.0)
MCHC: 34.1 g/dL (ref 30.0–36.0)
MCV: 83 fL (ref 78.0–100.0)
Platelets: 241 10*3/uL (ref 150–400)
RBC: 3.64 MIL/uL — ABNORMAL LOW (ref 3.87–5.11)
RDW: 13 % (ref 11.5–15.5)
WBC: 4.5 10*3/uL (ref 4.0–10.5)

## 2017-01-30 LAB — HCG, QUANTITATIVE, PREGNANCY: hCG, Beta Chain, Quant, S: 5031 m[IU]/mL — ABNORMAL HIGH (ref <5)

## 2017-01-30 MED ORDER — KETOROLAC TROMETHAMINE 60 MG/2ML IM SOLN
60.0000 mg | Freq: Once | INTRAMUSCULAR | Status: AC
  Administered 2017-01-30: 60 mg via INTRAMUSCULAR
  Filled 2017-01-30: qty 2

## 2017-01-30 MED ORDER — TRAMADOL HCL 50 MG PO TABS: 50.0000 mg | ORAL_TABLET | Freq: Four times a day (QID) | ORAL | 0 refills | Status: AC | PRN

## 2017-01-30 MED ORDER — IBUPROFEN 600 MG PO TABS: 600.0000 mg | ORAL_TABLET | Freq: Four times a day (QID) | ORAL | 1 refills | Status: AC | PRN

## 2017-01-30 NOTE — Discharge Instructions (Signed)

## 2017-01-30 NOTE — MAU Provider Note (Signed)
History     CSN: [id number]  Arrival date and time: 01/30/17 7829   First Provider Initiated Contact with Patient 01/30/17 2101      Chief Complaint  Patient presents with  . Abdominal Pain   HPI Victoria Weber is a 27 y.o. G2P1001 at Unknown who presents with abdominal pain. Patient had surgical TAB on 01/25/17 when she was 8 wks. Had some abdominal cramping after procedure but states pain has increased since 2 days ago. Reports generalized abdominal & pelvic pain that she describes as constant cramping. States she occassionally has sharp pains that are worse with bowel movements. States very painful to have BM. Last BM was yesterday & reports that it wasn't hard but very painful in her abdomen. Denies fever/chills, dysuria, n/v. Vaginal bleeding has increased today but not saturating pads or passing clots.  Went to PCP today who sent her here for "peritoneal signs".  OB History    Gravida Para Term Preterm AB Living   2 1 1     1    SAB TAB Ectopic Multiple Live Births         0 1      Obstetric Comments   G2 -- 8 wk TAB on 01/25/17       Past Medical History:  Diagnosis Date  . Asthma   . Concussion   . Infection    UTI  . Positive PPD, treated   . Seasonal allergies     Past Surgical History:  Procedure Laterality Date  . CESAREAN SECTION N/A 01/03/2014   Procedure: CESAREAN SECTION;  Surgeon: Antionette Char, MD;  Location: [facility];  Service: Obstetrics;  Laterality: N/A;  . NO PAST SURGERIES    . WISDOM TOOTH EXTRACTION      Family History  Adopted: Yes  Family history unknown: Yes    Social History   Tobacco Use  . Smoking status: Never Smoker  . Smokeless tobacco: Never Used  Substance Use Topics  . Alcohol use: Yes    Alcohol/week: 0.0 oz    Comment: Socially   . Drug use: No    Allergies: No Known Allergies  Medications Prior to Admission  Medication Sig Dispense Refill Last Dose  . albuterol (PROVENTIL HFA;VENTOLIN HFA) 108 (90 BASE) MCG/ACT  inhaler Inhale 2 puffs into the lungs every 6 (six) hours as needed for wheezing or shortness of breath.   Past Month at Unknown time  . cephALEXin (KEFLEX) 500 MG capsule Take 1 capsule (500 mg total) by mouth 4 (four) times daily. 28 capsule 0   . cetirizine (ZYRTEC) 10 MG tablet Take 10 mg by mouth daily.   More than a month at Unknown time  . erythromycin ophthalmic ointment Place a 1/2 inch ribbon of ointment into the lower eyelid BID for 7 days 1 g 0 More than a month at Unknown time  . ibuprofen (ADVIL,MOTRIN) 600 MG tablet Take 1 tablet (600 mg total) by mouth every 6 (six) hours as needed for mild pain. 30 tablet 5 More than a month at Unknown time  . ibuprofen (ADVIL,MOTRIN) 800 MG tablet Take 1 tablet (800 mg total) by mouth every 8 (eight) hours as needed for mild pain or cramping. 30 tablet 5 More than a month at Unknown time  . Magnesium Oxide 200 MG TABS Take 1 tablet (200 mg total) by mouth daily. 30 tablet 11 More than a month at Unknown time  . metroNIDAZOLE (FLAGYL) 500 MG tablet Take 1 tablet (500 mg total)  by mouth 2 (two) times daily. X 7 days 14 tablet 0   . Norethindrone Acetate-Ethinyl Estrad-FE (LOESTRIN 24 FE) 1-20 MG-MCG(24) tablet Take 1 tablet by mouth daily. 1 Package 11 More than a month at Unknown time  . ondansetron (ZOFRAN ODT) 4 MG disintegrating tablet 4mg  ODT q4 hours prn nausea/vomit 10 tablet 0 More than a month at Unknown time    Review of Systems  Constitutional: Negative.   Gastrointestinal: Positive for abdominal distention and abdominal pain. Negative for constipation, diarrhea, nausea, rectal pain and vomiting.  Genitourinary: Positive for vaginal bleeding. Negative for dysuria and vaginal discharge.  Musculoskeletal: Positive for back pain.   Physical Exam   Blood pressure 130/69, pulse 67, temperature 99.3 F (37.4 C), temperature source Oral, resp. rate 16, height 5\' 3"  (1.6 m), weight 165 lb (74.8 kg), SpO2 100 %.  Physical Exam  Nursing note  and vitals reviewed. Constitutional: She is oriented to person, place, and time. She appears well-developed and well-nourished. No distress.  HENT:  Head: Normocephalic and atraumatic.  Eyes: Conjunctivae are normal. Right eye exhibits no discharge. Left eye exhibits no discharge. No scleral icterus.  Neck: Normal range of motion.  Cardiovascular: Normal rate, regular rhythm and normal heart sounds.  No murmur heard. Respiratory: Effort normal and breath sounds normal. No respiratory distress. She has no wheezes.  GI: Soft. Bowel sounds are decreased. There is generalized tenderness (worse in lower abdomen). There is rigidity and guarding. There is no rebound.  Neurological: She is alert and oriented to person, place, and time.  Skin: Skin is warm and dry. She is not diaphoretic.  Psychiatric: She has a normal mood and affect. Her behavior is normal. Judgment and thought content normal.   Labs: Results for orders placed or performed during the hospital encounter of 01/30/17 (from the past 24 hour(s))  CBC     Status: Abnormal   Collection Time: 01/30/17  7:19 PM  Result Value Ref Range   WBC 4.5 4.0 - 10.5 K/uL   RBC 3.64 (Weber) 3.87 - 5.11 MIL/uL   Hemoglobin 10.3 (Weber) 12.0 - 15.0 g/dL   HCT 32.4 (Weber) 40.1 - 02.7 %   MCV 83.0 78.0 - 100.0 fL   MCH 28.3 26.0 - 34.0 pg   MCHC 34.1 30.0 - 36.0 g/dL   RDW 25.3 66.4 - 40.3 %   Platelets 241 150 - 400 K/uL  hCG, quantitative, pregnancy     Status: Abnormal   Collection Time: 01/30/17  7:19 PM  Result Value Ref Range   hCG, Beta Chain, Quant, S 5,031 (H) <5 mIU/mL  Urinalysis, Routine w reflex microscopic     Status: Abnormal   Collection Time: 01/30/17 10:40 PM  Result Value Ref Range   Color, Urine YELLOW YELLOW   APPearance CLEAR CLEAR   Specific Gravity, Urine 1.025 1.005 - 1.030   pH 5.0 5.0 - 8.0   Glucose, UA NEGATIVE NEGATIVE mg/dL   Hgb urine dipstick SMALL (A) NEGATIVE   Bilirubin Urine NEGATIVE NEGATIVE   Ketones, ur  NEGATIVE NEGATIVE mg/dL   Protein, ur NEGATIVE NEGATIVE mg/dL   Nitrite NEGATIVE NEGATIVE   Leukocytes, UA NEGATIVE NEGATIVE   RBC / HPF 0-5 0 - 5 RBC/hpf   WBC, UA 0-5 0 - 5 WBC/hpf   Bacteria, UA NONE SEEN NONE SEEN   Squamous Epithelial / LPF 0-5 (A) NONE SEEN   Mucus PRESENT     Imaging: Dg Abd 2 Views  Result Date: 01/30/2017 CLINICAL DATA:  27 y/o F; 3 days of abdominal pain. Excessive vaginal bleeding and clots. EXAM: ABDOMEN - 2 VIEW COMPARISON:  None. FINDINGS: The bowel gas pattern is normal. There is no evidence of free air. No radio-opaque calculi or other significant radiographic abnormality is seen. IMPRESSION: Normal bowel gas pattern. Electronically Signed   By: Mitzi HansenLance  Furusawa-Stratton M.D.   On: 01/30/2017 22:30   [place] Pelvic Complete With Transvaginal  Result Date: 01/30/2017 CLINICAL DATA:  Abdominal pain since therapeutic abortion on 01/25/2017. Patient was approximately 8 weeks. Heavy bleeding. History of C-section. EXAM: TRANSABDOMINAL AND TRANSVAGINAL ULTRASOUND OF PELVIS TECHNIQUE: Both transabdominal and transvaginal ultrasound examinations of the pelvis were performed. Transabdominal technique was performed for global imaging of the pelvis including uterus, ovaries, adnexal regions, and pelvic cul-de-sac. It was necessary to proceed with endovaginal exam following the transabdominal exam to visualize the endometrium and adnexal regions. COMPARISON:  None FINDINGS: Uterus Measurements: 10.8 x 5.9 x 8.0 centimeters. Uterus is anteflexed. No mass. Endometrium Thickness: 7.7 millimeters. Minimally heterogeneous. No evidence for retained products of conception. Right ovary Measurements: 3.7 x 1.6 x 3.1 centimeters. Normal appearance/no adnexal mass. Left ovary Measurements: 2.6 x 1.9 x 1.8 centimeters. Normal appearance/no adnexal mass. Other findings Trace free pelvic fluid, likely physiologic. IMPRESSION: 1. Anteflexed uterus with normal appearance. No evidence for retained  products of conception. 2. Normal appearance of both ovaries. Electronically Signed   By: Norva PavlovElizabeth  Brown M.D.   On: 01/30/2017 20:20   Patient in radiology. Care turned over to Wynelle BourgeoisMarie Lelani Garnett CNM    Judeth HornLawrence, Erin, NP 01/30/2017 10:01 PM  MAU Course  Procedures  MDM VSS CBC & HCG pending Ultrasound shows no evidence of RPOC & no FF Toradol & abdominal xray ordered Felt better with toradol Abd series is negative, no evidence for ileus Labs are normal, no UTI, normal WBC Consulted Dr Debroah LoopArnold who states we have done everything to rule out complications Pain may be GI in nature, possibly just post-procedure cramping  Assessment and Plan  S/P abortion Abdominal pain  Discharge home Rx Ibuprofen and limited Tramadol for pain Followup with primary doctor in a few days Warning signs to come back for reviewed Victoria Weber, Victoria Weber, CNM

## 2017-01-30 NOTE — MAU Note (Signed)
Pt states she is having severe lower abd pain, had a TAB on 1/12. States it hurts to have a bm, heavy bleeding. Denies fever.

## 2018-03-29 IMAGING — CR DG ABDOMEN 2V
2 series · 2 of 2 positions shown · non-contrast
Comparison: None.

CLINICAL DATA: 26 y/o F; 3 days of abdominal pain. Excessive
vaginal bleeding and clots.

EXAM:
ABDOMEN - 2 VIEW

[abdomen erect]
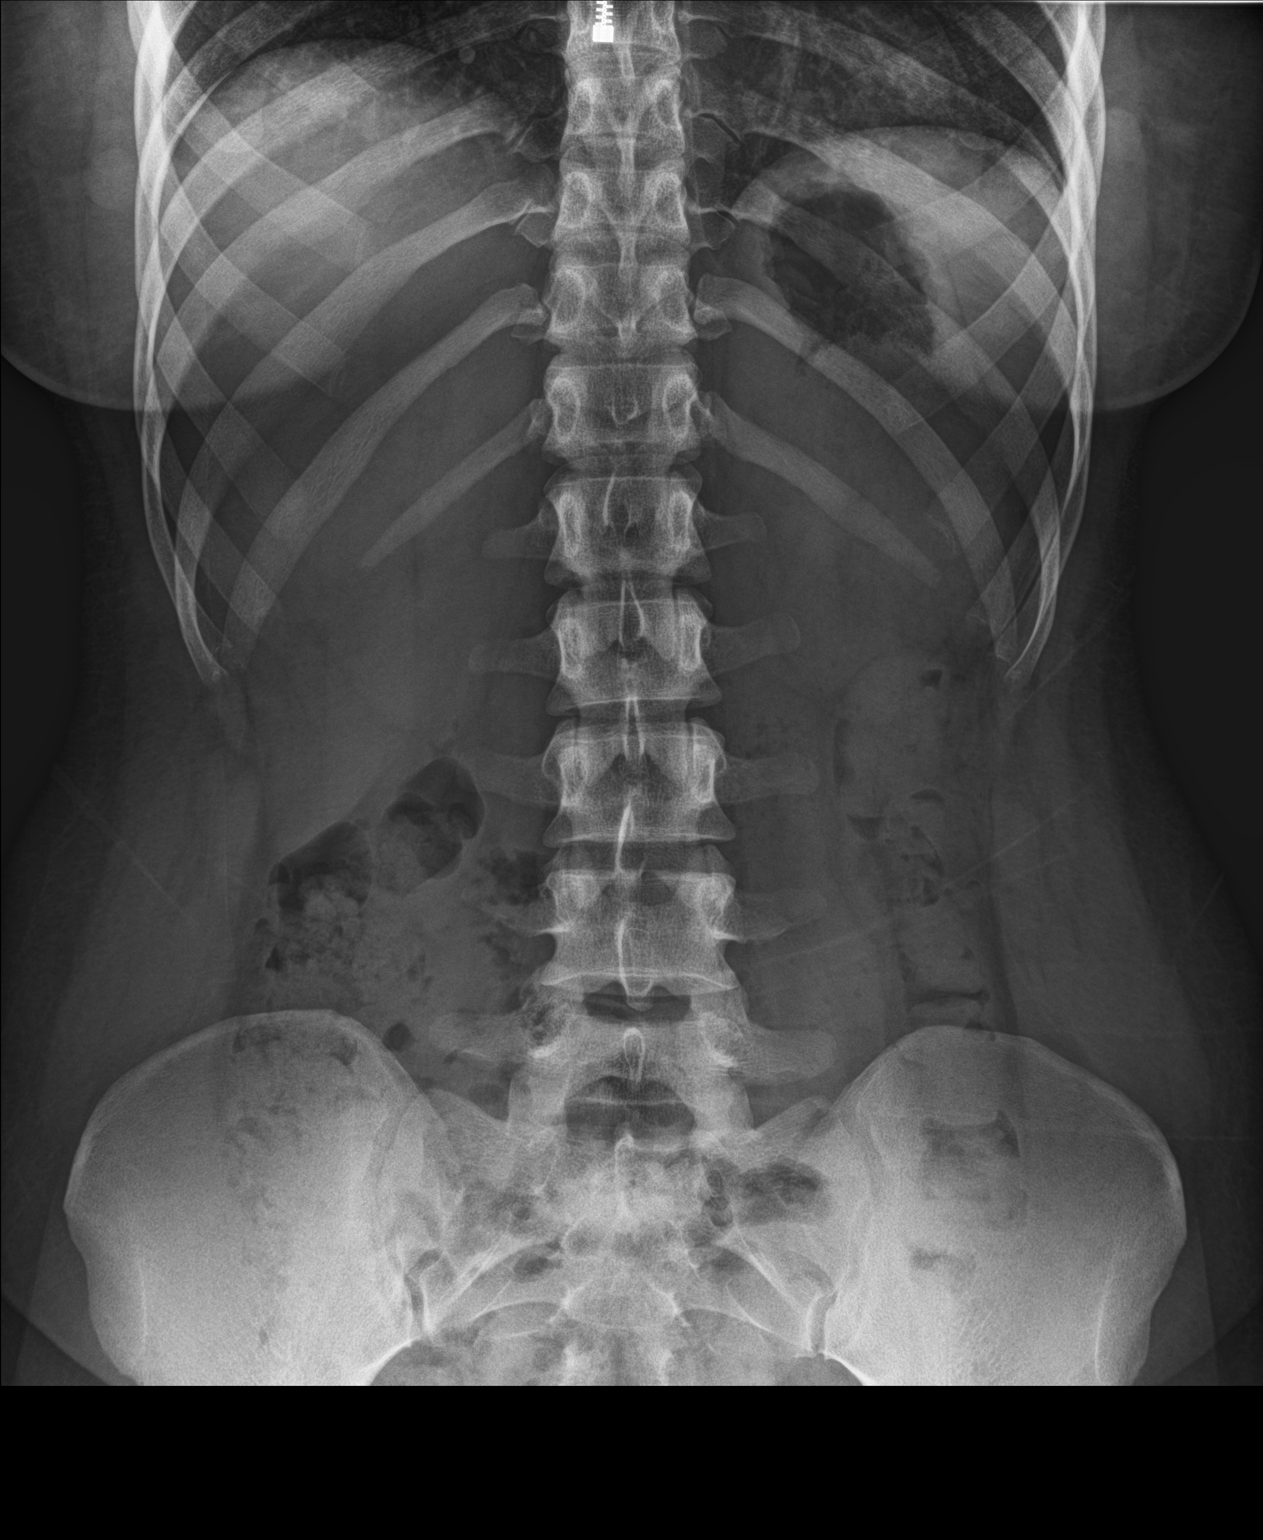

[abdomen supine]
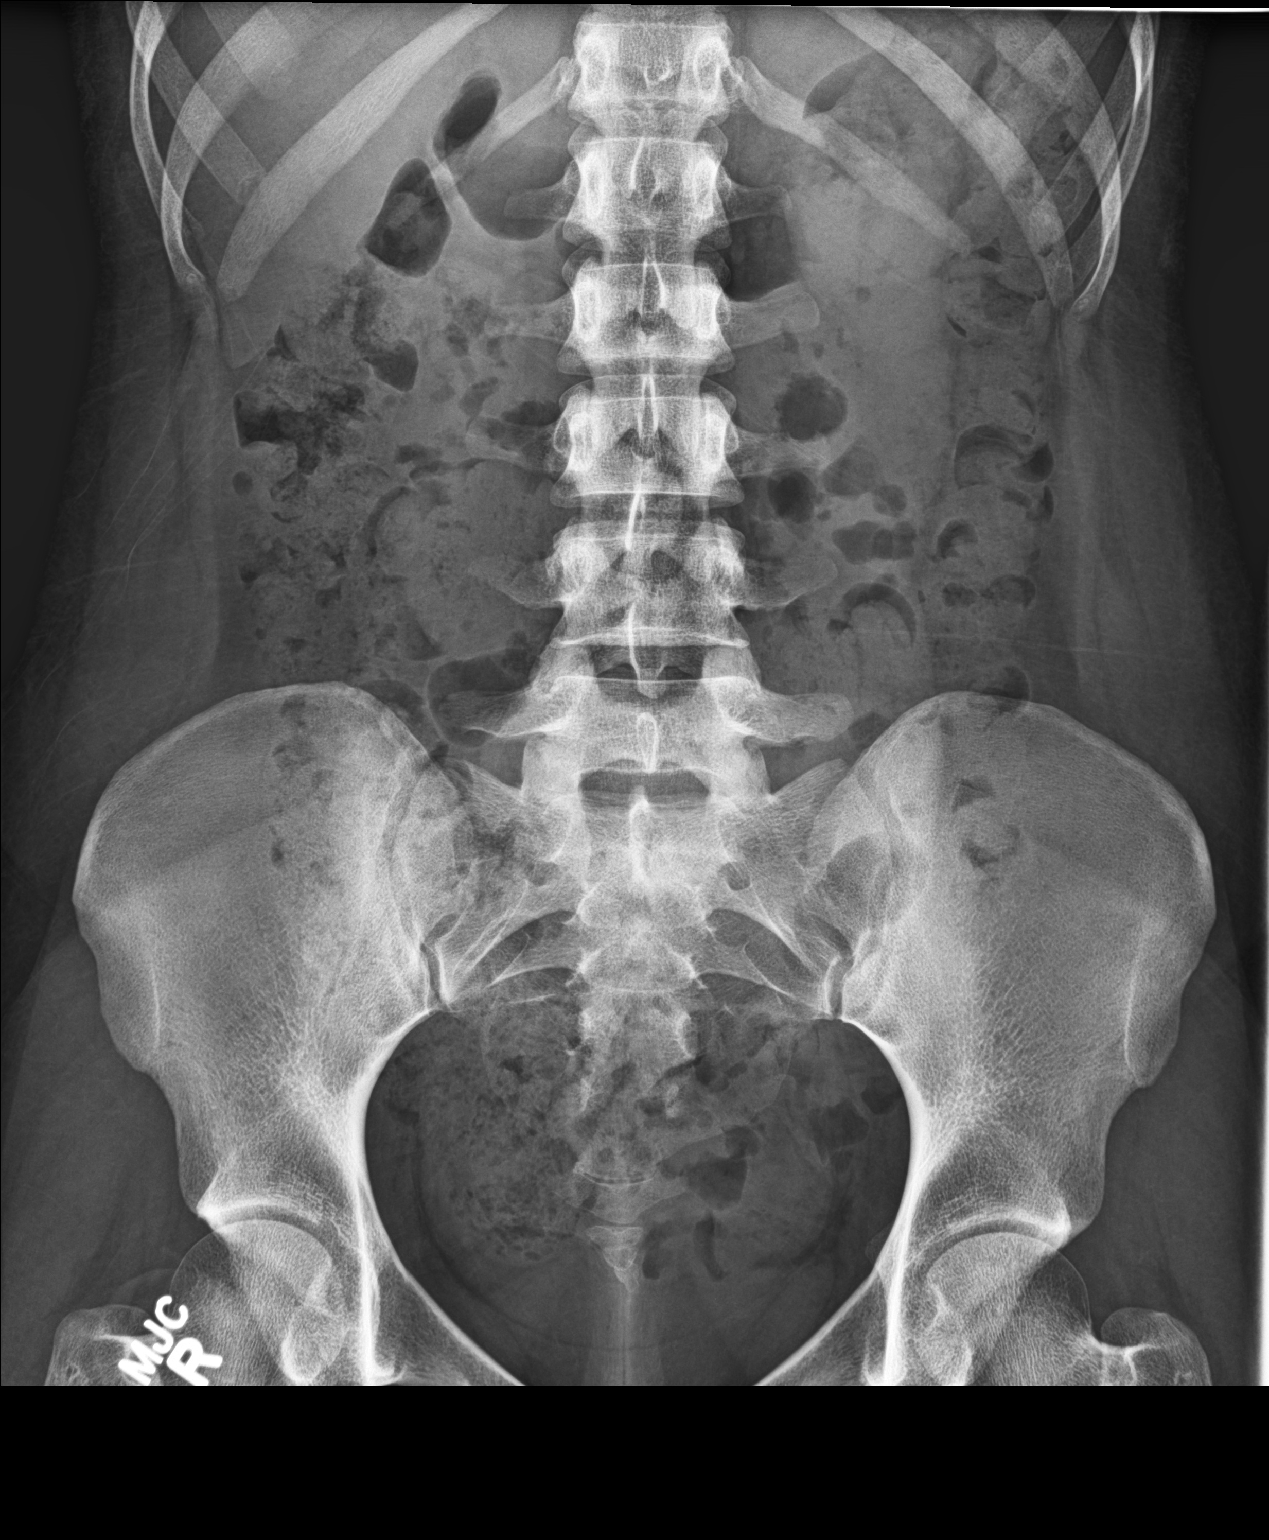

[2 of 2 positions shown; findings below may reference images not displayed]

FINDINGS: The bowel gas pattern is normal. There is no evidence of free air.
No radio-opaque calculi or other significant radiographic
abnormality is seen.
IMPRESSION: Normal bowel gas pattern.

By: Isimeli Leonard M.D.

## 2018-03-29 IMAGING — US US PELVIS COMPLETE TRANSABD/TRANSVAG
1 series · 15 of 25 positions shown · non-contrast
Comparison: None

CLINICAL DATA: Abdominal pain since therapeutic abortion on
01/25/2017. Patient was approximately 8 weeks. Heavy bleeding.
History of C-section.

EXAM:
TRANSABDOMINAL AND TRANSVAGINAL ULTRASOUND OF PELVIS
TECHNIQUE: Both transabdominal and transvaginal ultrasound examinations of the
pelvis were performed. Transabdominal technique was performed for
global imaging of the pelvis including uterus, ovaries, adnexal
regions, and pelvic cul-de-sac. It was necessary to proceed with
endovaginal exam following the transabdominal exam to visualize the
endometrium and adnexal regions.

[Series 1: us pelvis complete transabd/transvag · 76 acquisitions, 15 frames shown]
[im 1/76]
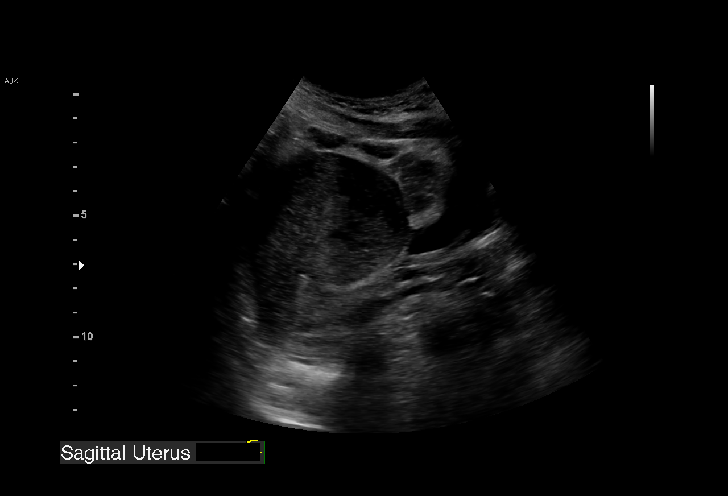
[im 7/76]
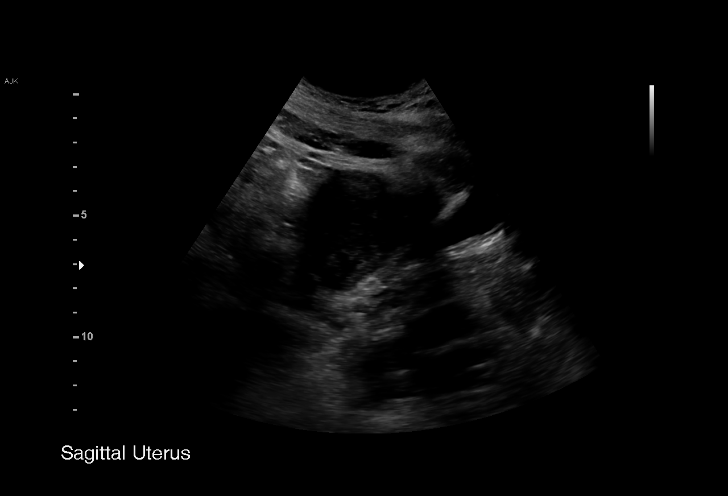
[im 13/76]
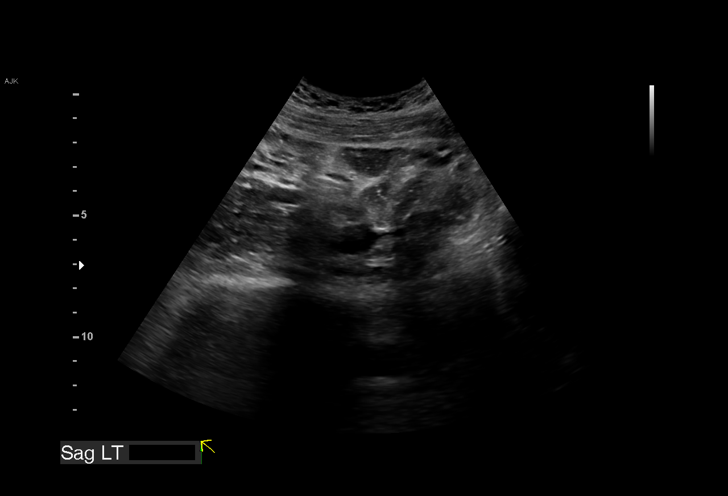
[im 16/76]
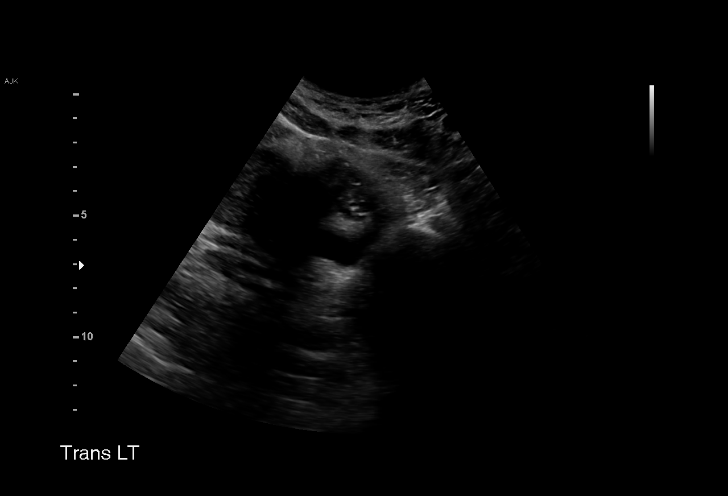
[im 22/76]
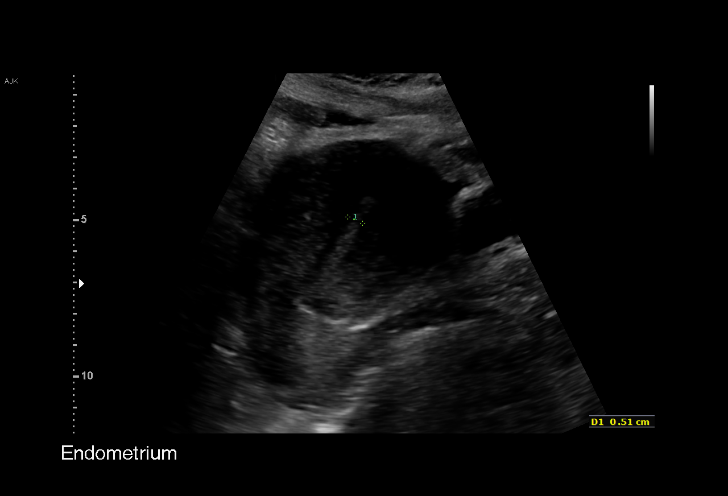
[im 29/76]
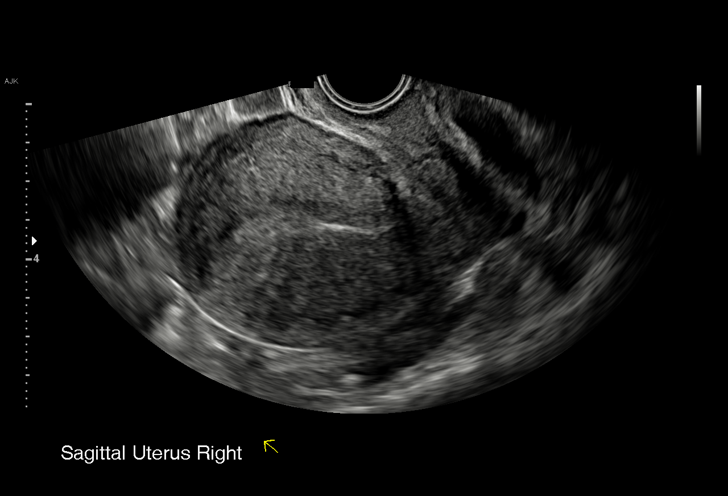
[im 32/76]
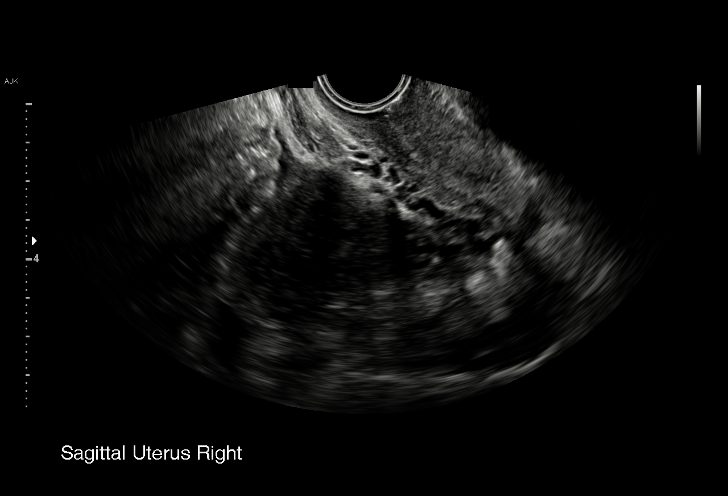
[im 38/76]
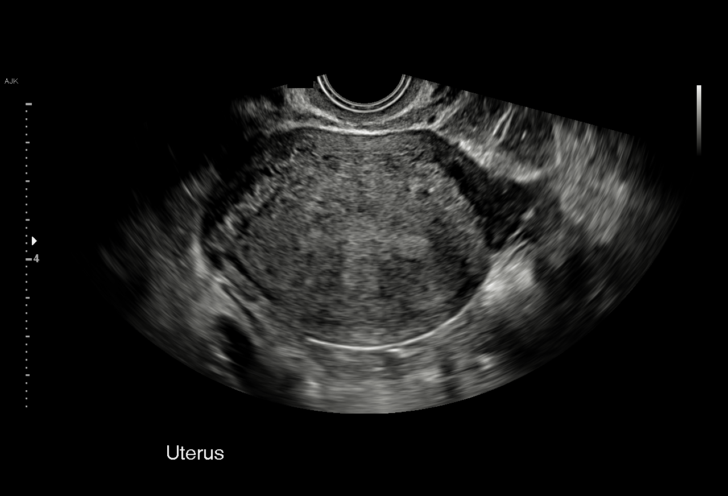
[im 44/76]
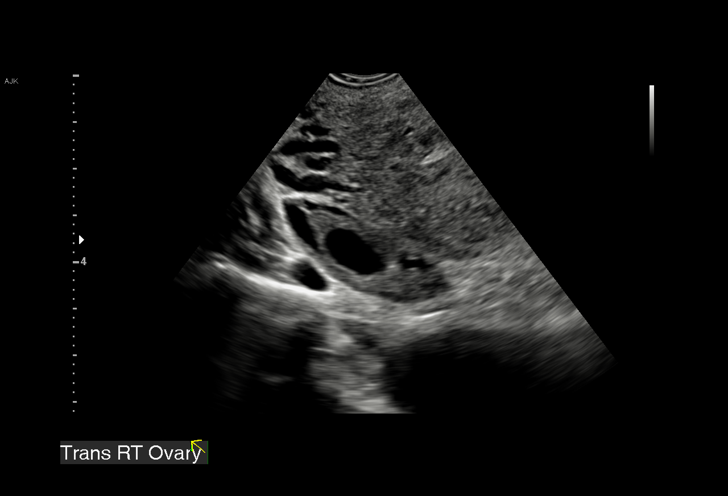
[im 47/76]
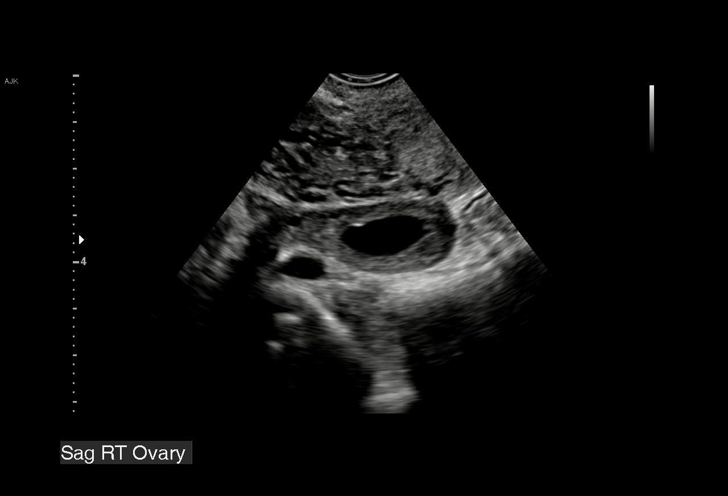
[im 54/76]
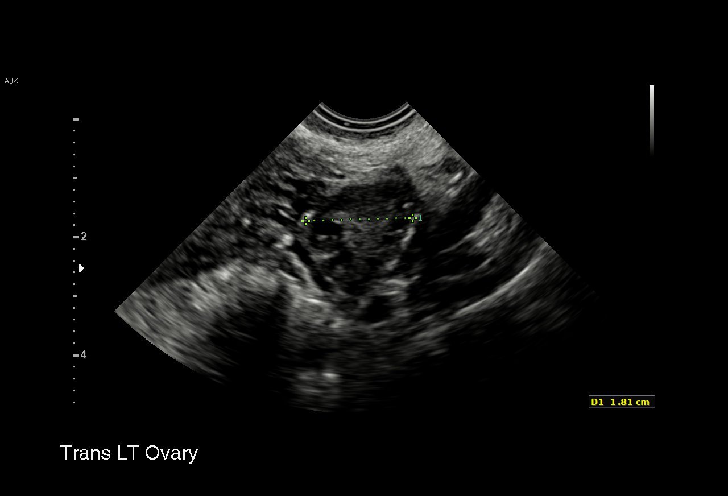
[im 60/76]
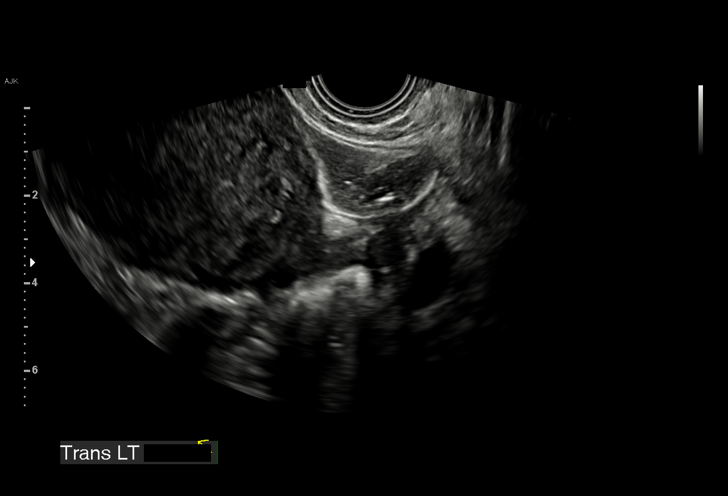
[im 63/76]
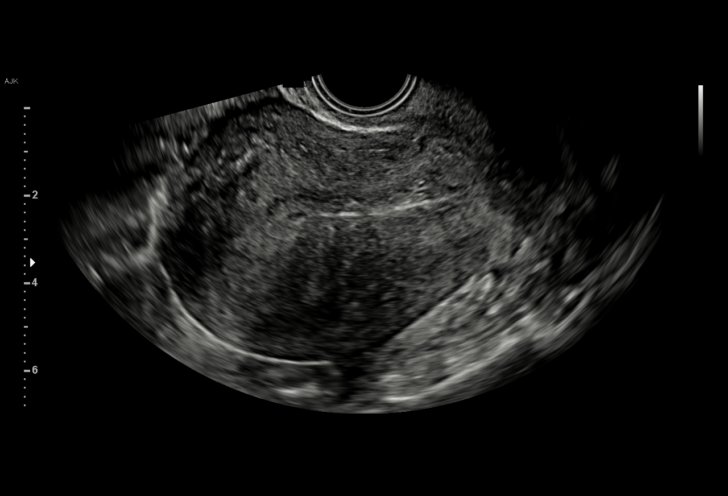
[im 69/76]
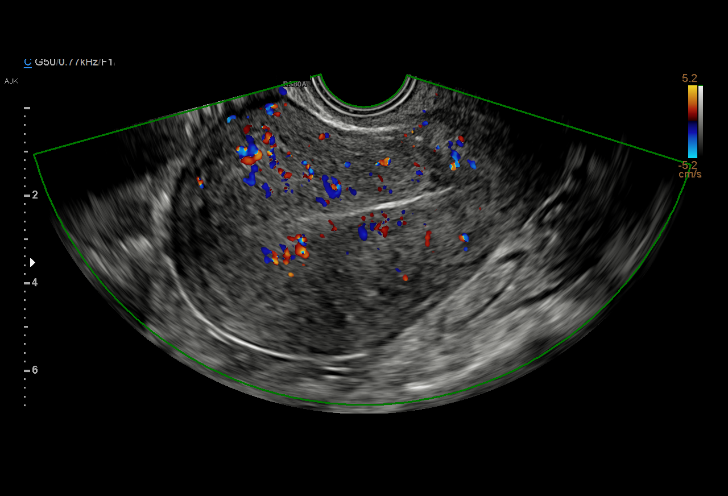
[im 76/76]
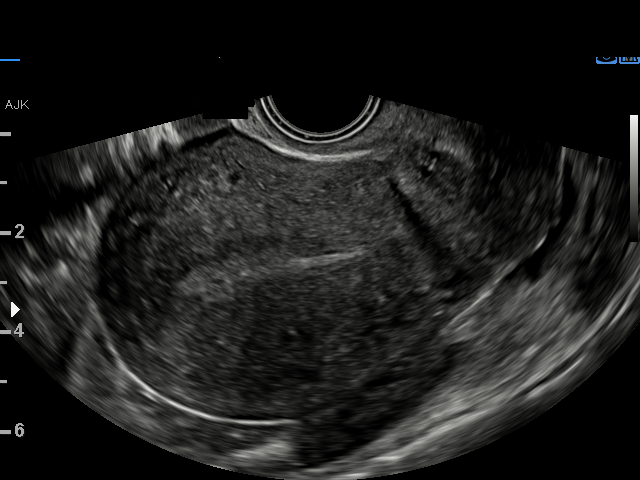

[15 of 25 positions shown; findings below may reference images not displayed]

FINDINGS: Uterus

Measurements: 10.8 x 5.9 x 8.0 centimeters. Uterus is anteflexed. No
mass.

Endometrium

Thickness: 7.7 millimeters. Minimally heterogeneous. No evidence for
retained products of conception.

Right ovary

Measurements: 3.7 x 1.6 x 3.1 centimeters. Normal appearance/no
adnexal mass.

Left ovary

Measurements: 2.6 x 1.9 x 1.8 centimeters.. Normal appearance/no
adnexal mass.

Other findings

Trace free pelvic fluid, likely physiologic.
IMPRESSION: 1. Anteflexed uterus with normal appearance. No evidence for
retained products of conception.
2. Normal appearance of both ovaries.

## 2021-05-17 ENCOUNTER — Ambulatory Visit: Payer: Self-pay | Admitting: Internal Medicine

## 2021-06-26 ENCOUNTER — Ambulatory Visit: Payer: Self-pay | Admitting: Internal Medicine

## 2021-06-26 NOTE — Progress Notes (Deleted)
New Patient Note  RE: Victoria Weber MRN: [id number] DOB: 1990-06-05 Date of Office Visit: 06/26/2021  Consult requested by: Devra Dopp, MD Primary care provider: Devra Dopp, MD  Chief Complaint: No chief complaint on file.  History of Present Illness: I had the pleasure of seeing Victoria Weber for initial evaluation at the [facility] on 06/26/2021. She is a 31 y.o. female, who is referred here by Devra Dopp, MD for the evaluation of ***.  History obtained from patient  and {Blank single:19197::"mother","father","interpreter"}.  ***  Assessment and Plan: Dineen is a 31 y.o. female with: No diagnosis found. Plan: There are no Patient Instructions on file for this visit. No follow-ups on file.  No orders of the defined types were placed in this encounter.  Lab Orders  No laboratory test(s) ordered today    Other allergy screening: Asthma: {Blank single:19197::"yes","no"} Rhino conjunctivitis: {Blank single:19197::"yes","no"} Food allergy: {Blank single:19197::"yes","no"} Medication allergy: {Blank single:19197::"yes","no"} Hymenoptera allergy: {Blank single:19197::"yes","no"} Urticaria: {Blank single:19197::"yes","no"} Eczema:{Blank single:19197::"yes","no"} History of recurrent infections suggestive of immunodeficency: {Blank single:19197::"yes","no"}  Diagnostics: Spirometry:  Tracings reviewed. Her effort: {Blank single:19197::"Good reproducible efforts.","It was hard to get consistent efforts and there is a question as to whether this reflects a maximal maneuver.","Poor effort, data can not be interpreted."} FVC: ***L FEV1: ***L, ***% predicted FEV1/FVC ratio: ***% Interpretation: {Blank single:19197::"Spirometry consistent with mild obstructive disease","Spirometry consistent with moderate obstructive disease","Spirometry consistent with severe obstructive disease","Spirometry consistent with possible restrictive disease","Spirometry  consistent with mixed obstructive and restrictive disease","Spirometry uninterpretable due to technique","Spirometry consistent with normal pattern","No overt abnormalities noted given today's efforts"}.  Please see scanned spirometry results for details.  Skin Testing: {Blank single:19197::"Select foods","Environmental allergy panel","Environmental allergy panel and select foods","Food allergy panel","None","Deferred due to recent antihistamines use"}. *** Results interpreted by myself and discussed with patient/family.   Past Medical History: Patient Active Problem List   Diagnosis Date Noted   CN (constipation) 01/18/2014   Indication for care in labor or delivery 01/03/2014   Cesarean delivery delivered 01/03/2014   GERD without esophagitis 07/19/2013   Supervision of normal first pregnancy 06/21/2013   Idiopathic scoliosis and kyphoscoliosis 01/06/2012   Airway hyperreactivity 01/01/2012   Past Medical History:  Diagnosis Date   Asthma    Concussion    Infection    UTI   Positive PPD, treated    Seasonal allergies    Past Surgical History: Past Surgical History:  Procedure Laterality Date   CESAREAN SECTION N/A 01/03/2014   Procedure: CESAREAN SECTION;  Surgeon: Antionette Char, MD;  Location: [facility];  Service: Obstetrics;  Laterality: N/A;   NO PAST SURGERIES     WISDOM TOOTH EXTRACTION     Medication List:  Current Outpatient Medications  Medication Sig Dispense Refill   albuterol (PROVENTIL HFA;VENTOLIN HFA) 108 (90 BASE) MCG/ACT inhaler Inhale 2 puffs into the lungs every 6 (six) hours as needed for wheezing or shortness of breath.     cetirizine (ZYRTEC) 10 MG tablet Take 10 mg by mouth daily.     ibuprofen (ADVIL,MOTRIN) 600 MG tablet Take 1 tablet (600 mg total) by mouth every 6 (six) hours as needed. 30 tablet 1   traMADol (ULTRAM) 50 MG tablet Take 1 tablet (50 mg total) by mouth every 6 (six) hours as needed for moderate pain. 15 tablet 0   No current  facility-administered medications for this visit.   Allergies: No Known Allergies Social History: Social History   Socioeconomic History   Marital status: Single  Spouse name: Not on file   Number of children: Not on file   Years of education: Not on file   Highest education level: Not on file  Occupational History   Not on file  Tobacco Use   Smoking status: Never   Smokeless tobacco: Never  Substance and Sexual Activity   Alcohol use: Yes    Alcohol/week: 0.0 standard drinks of alcohol    Comment: Socially    Drug use: No   Sexual activity: Yes    Partners: Male    Birth control/protection: None  Other Topics Concern   Not on file  Social History Narrative   ** Merged History Encounter **       Social Determinants of Health   Financial Resource Strain: Not on file  Food Insecurity: Not on file  Transportation Needs: Not on file  Physical Activity: Not on file  Stress: Not on file  Social Connections: Not on file   Lives in a ***. Smoking: *** Occupation: ***  Environmental HistorySurveyor, minerals in the house: Copywriter, advertising in the family room: {Blank single:19197::"yes","no"} Carpet in the bedroom: {Blank single:19197::"yes","no"} Heating: {Blank single:19197::"electric","gas","heat pump"} Cooling: {Blank single:19197::"central","window","heat pump"} Pet: {Blank single:19197::"yes ***","no"}  Family History: Family History  Adopted: Yes  Family history unknown: Yes     ROS: All others negative except as noted per HPI.   Objective: There were no vitals taken for this visit. There is no height or weight on file to calculate BMI.  General Appearance:  Alert, cooperative, no distress, appears stated age  Head:  Normocephalic, without obvious abnormality, atraumatic  Eyes:  Conjunctiva clear, EOM's intact  Nose: Nares normal, {Blank multiple:19196:a:"***","hypertrophic turbinates","normal mucosa","no visible anterior  polyps","septum midline"}  Throat: Lips, tongue normal; teeth and gums normal, {Blank multiple:19196:a:"***","normal posterior oropharynx","tonsils 2+","tonsils 3+","no tonsillar exudate","+ cobblestoning"}  Neck: Supple, symmetrical  Lungs:   {Blank multiple:19196:a:"***","clear to auscultation bilaterally","end-expiratory wheezing","wheezing throughout"}, Respirations unlabored, {Blank multiple:19196:a:"***","no coughing","intermittent dry coughing"}  Heart:  {Blank multiple:19196:a:"***","regular rate and rhythm","no murmur"}, Appears well perfused  Extremities: No edema  Skin: Skin color, texture, turgor normal, no rashes or lesions on visualized portions of skin  Neurologic: No gross deficits   The plan was reviewed with the patient/family, and all questions/concerned were addressed.  It was my pleasure to see Victoria Weber today and participate in her care. Please feel free to contact me with any questions or concerns.  Sincerely,  Ferol Luz, MD Allergy & Immunology  [facility] office: [phone] [facility] office: [phone]

## 2023-10-03 ENCOUNTER — Emergency Department (HOSPITAL_BASED_OUTPATIENT_CLINIC_OR_DEPARTMENT_OTHER)

## 2023-10-03 ENCOUNTER — Emergency Department (HOSPITAL_BASED_OUTPATIENT_CLINIC_OR_DEPARTMENT_OTHER)
Admission: EM | Admit: 2023-10-03 | Discharge: 2023-10-03 | Disposition: A | Discharge status: Home / Self Care | Attending: Emergency Medicine | Admitting: Emergency Medicine

## 2023-10-03 ENCOUNTER — Encounter (HOSPITAL_BASED_OUTPATIENT_CLINIC_OR_DEPARTMENT_OTHER): Payer: Self-pay | Admitting: Emergency Medicine

## 2023-10-03 ENCOUNTER — Other Ambulatory Visit: Payer: Self-pay

## 2023-10-03 DIAGNOSIS — R0602 Shortness of breath: Secondary | ICD-10-CM | POA: Diagnosis not present

## 2023-10-03 DIAGNOSIS — J45909 Unspecified asthma, uncomplicated: Secondary | ICD-10-CM | POA: Diagnosis not present

## 2023-10-03 DIAGNOSIS — H538 Other visual disturbances: Secondary | ICD-10-CM | POA: Diagnosis not present

## 2023-10-03 DIAGNOSIS — R519 Headache, unspecified: Secondary | ICD-10-CM | POA: Diagnosis present

## 2023-10-03 DIAGNOSIS — D72819 Decreased white blood cell count, unspecified: Secondary | ICD-10-CM | POA: Insufficient documentation

## 2023-10-03 DIAGNOSIS — R600 Localized edema: Secondary | ICD-10-CM | POA: Diagnosis not present

## 2023-10-03 DIAGNOSIS — R1110 Vomiting, unspecified: Secondary | ICD-10-CM | POA: Insufficient documentation

## 2023-10-03 LAB — BASIC METABOLIC PANEL WITH GFR
Anion gap: 9 (ref 5–15)
BUN: 10 mg/dL (ref 6–20)
CO2: 25 mmol/L (ref 22–32)
Calcium: 9.2 mg/dL (ref 8.9–10.3)
Chloride: 104 mmol/L (ref 98–111)
Creatinine, Ser: 0.95 mg/dL (ref 0.44–1.00)
GFR, Estimated: >60 mL/min (ref >60)
Glucose, Bld: 106 mg/dL — ABNORMAL HIGH (ref 70–99)
Potassium: 4.2 mmol/L (ref 3.5–5.1)
Sodium: 138 mmol/L (ref 135–145)

## 2023-10-03 LAB — CBC
HCT: 36.1 % (ref 36.0–46.0)
Hemoglobin: 12.1 g/dL (ref 12.0–15.0)
MCH: 29.2 pg (ref 26.0–34.0)
MCHC: 33.5 g/dL (ref 30.0–36.0)
MCV: 87.2 fL (ref 80.0–100.0)
Platelets: 238 K/uL (ref 150–400)
RBC: 4.14 MIL/uL (ref 3.87–5.11)
RDW: 12.1 % (ref 11.5–15.5)
WBC: 3.6 K/uL — ABNORMAL LOW (ref 4.0–10.5)
nRBC: 0 % (ref 0.0–0.2)

## 2023-10-03 LAB — URINALYSIS, ROUTINE W REFLEX MICROSCOPIC
Bilirubin Urine: NEGATIVE
Glucose, UA: NEGATIVE mg/dL
Hgb urine dipstick: NEGATIVE
Ketones, ur: NEGATIVE mg/dL
Leukocytes,Ua: NEGATIVE
Nitrite: NEGATIVE
Protein, ur: NEGATIVE mg/dL
Specific Gravity, Urine: 1.025 (ref 1.005–1.030)
pH: 7 (ref 5.0–8.0)

## 2023-10-03 LAB — RESP PANEL BY RT-PCR (RSV, FLU A&B, COVID)  RVPGX2
Influenza A by PCR: NEGATIVE
Influenza B by PCR: NEGATIVE
Resp Syncytial Virus by PCR: NEGATIVE
SARS Coronavirus 2 by RT PCR: NEGATIVE

## 2023-10-03 LAB — HCG, SERUM, QUALITATIVE: Preg, Serum: NEGATIVE

## 2023-10-03 LAB — LIPASE, BLOOD: Lipase: 40 U/L (ref 11–51)

## 2023-10-03 LAB — CBG MONITORING, ED: Glucose-Capillary: 97 mg/dL (ref 70–99)

## 2023-10-03 LAB — PRO BRAIN NATRIURETIC PEPTIDE: Pro Brain Natriuretic Peptide: <50 pg/mL (ref <300.0)

## 2023-10-03 MED ORDER — KETOROLAC TROMETHAMINE 15 MG/ML IJ SOLN
15.0000 mg | Freq: Once | INTRAMUSCULAR | Status: AC
Start: 2023-10-03 — End: 2023-10-03
  Administered 2023-10-03: 15 mg via INTRAVENOUS
  Filled 2023-10-03: qty 1

## 2023-10-03 MED ORDER — DIPHENHYDRAMINE HCL 50 MG/ML IJ SOLN
25.0000 mg | Freq: Once | INTRAMUSCULAR | Status: AC
  Administered 2023-10-03: 25 mg via INTRAVENOUS
  Filled 2023-10-03: qty 1

## 2023-10-03 MED ORDER — PROCHLORPERAZINE EDISYLATE 10 MG/2ML IJ SOLN
10.0000 mg | Freq: Once | INTRAMUSCULAR | Status: AC
  Administered 2023-10-03: 10 mg via INTRAVENOUS
  Filled 2023-10-03: qty 2

## 2023-10-03 NOTE — ED Provider Notes (Signed)
 [facility] Provider Note   CSN: [id number] Arrival date & time: 10/03/23  1024     Patient presents with: Shortness of Breath   Victoria Weber is a 32 y.o. female.   33 year old female presenting with multiple complaints.  Patient tells me that for the past week she has felt more lethargic as well as noting shortness of breath on exertion despite the fact that she is typically physically active.  She also has new bilateral lower extremity edema for approximately 1 week, reports that both legs/feet are typically equally swollen however at times the left is more swollen than the right, she sits at a desk all day for work and reports that she is not typically standing for long periods of time, the swelling is even present when she wakes up in the morning.  She endorses a continual headache that she describes as tension with occasional associated blurred vision in both eyes, she has not taken any medications for relief of her headache.  She had 1 episode of vomiting this morning which is unusual for her, she denies fever, abdominal pain, diarrhea, dysuria, chest pain.  She saw urgent care this morning and was instructed to come to the emergency department for further workup.  She does not take any medications daily.  She is unsure if she may be pregnant.   Shortness of Breath Associated symptoms: headaches        Prior to Admission medications   Medication Sig Start Date End Date Taking? Authorizing Provider  albuterol (PROVENTIL HFA;VENTOLIN HFA) 108 (90 BASE) MCG/ACT inhaler Inhale 2 puffs into the lungs every 6 (six) hours as needed for wheezing or shortness of breath.    [provider]  cetirizine (ZYRTEC) 10 MG tablet Take 10 mg by mouth daily.    [provider]  ibuprofen  (ADVIL ,MOTRIN ) 600 MG tablet Take 1 tablet (600 mg total) by mouth every 6 (six) hours as needed. 01/30/17   Trudy Earnie CROME, CNM  traMADol  (ULTRAM )  50 MG tablet Take 1 tablet (50 mg total) by mouth every 6 (six) hours as needed for moderate pain. 01/30/17   Trudy Earnie CROME, CNM    Allergies: Patient has no known allergies.    Review of Systems  Neurological:  Positive for headaches.    Updated Vital Signs  Vitals:   10/03/23 1033 10/03/23 1034 10/03/23 1330 10/03/23 1343  BP: 137/81   117/72  Pulse: 74  70 80  Resp: 18  15 19   Temp: 98.6 F (37 C)     TempSrc: Oral     SpO2: 100%  100% 100%  Weight:  77.1 kg       Physical Exam Vitals and nursing note reviewed.  HENT:     Head: Normocephalic.  Eyes:     Extraocular Movements: Extraocular movements intact.     Pupils: Pupils are equal, round, and reactive to light.  Cardiovascular:     Rate and Rhythm: Normal rate and regular rhythm.     Heart sounds: Normal heart sounds.  Pulmonary:     Effort: Pulmonary effort is normal.     Breath sounds: Normal breath sounds.  Abdominal:     Palpations: Abdomen is soft.     Tenderness: There is no abdominal tenderness. There is no guarding.  Musculoskeletal:     Cervical back: Normal range of motion and neck supple. No rigidity or tenderness.     Comments: Moves all extremities spontaneously without difficulty  5/5 strength against resistance of bilateral upper and lower extremities  Skin:    General: Skin is warm and dry.  Neurological:     General: No focal deficit present.     Mental Status: She is alert and oriented to person, place, and time.     Sensory: No sensory deficit.     Motor: No weakness.     (all labs ordered are listed, but only abnormal results are displayed) Labs Reviewed  CBC - Abnormal; Notable for the following components:      Result Value   WBC 3.6 (*)    All other components within normal limits  BASIC METABOLIC PANEL WITH GFR - Abnormal; Notable for the following components:   Glucose, Bld 106 (*)    All other components within normal limits  RESP PANEL BY RT-PCR (RSV, FLU A&B, COVID)   RVPGX2  PRO BRAIN NATRIURETIC PEPTIDE  URINALYSIS, ROUTINE W REFLEX MICROSCOPIC  HCG, SERUM, QUALITATIVE  LIPASE, BLOOD  CBG MONITORING, ED    EKG: EKG Interpretation Date/Time:  Friday October 03 2023 10:43:47 EDT Ventricular Rate:  68 PR Interval:  137 QRS Duration:  95 QT Interval:  388 QTC Calculation: 413 R Axis:   76  Text Interpretation: Sinus rhythm Consider left atrial enlargement Artifact No significant change since last tracing Confirmed by Randol Simmonds [phone]) on 10/03/2023 10:57:08 AM  Radiology: No results found.   Procedures   Medications Ordered in the ED  prochlorperazine  (COMPAZINE ) injection 10 mg (10 mg Intravenous Given 10/03/23 1224)  diphenhydrAMINE  (BENADRYL ) injection 25 mg (25 mg Intravenous Given 10/03/23 1222)  ketorolac  (TORADOL ) 15 MG/ML injection 15 mg (15 mg Intravenous Given 10/03/23 1222)                                    Medical Decision Making This patient presents to the ED for concern of multiple complaints, this involves an extensive number of treatment options, and is a complaint that carries with it a high risk of complications and morbidity.  The differential diagnosis includes COVID/flu/RSV, tension headache, migraine headache, dehydration, dependent edema/venous insufficiency.   Co morbidities that complicate the patient evaluation  Asthma, GERD   Additional history obtained:  Additional history obtained from record review External records from outside source obtained and reviewed including urgent care note from this morning   Lab Tests:  I Ordered, and personally interpreted labs.  The pertinent results include: CBC notable for leukopenia with white blood cell count of 3.6, however this is largely stable as compared to previous results.  BMP unremarkable.  Lipase within normal limits.  BNP less than 50.  Serum hCG negative.  COVID/flu/RSV negative.   Imaging Studies ordered:  I ordered imaging studies including chest  x-ray for shortness of breath Patient declined chest x-ray today   Cardiac Monitoring: / EKG:  The patient was maintained on a cardiac monitor.  I personally viewed and interpreted the cardiac monitored which showed an underlying rhythm of: NSR  Problem List / ED Course / Critical interventions / Medication management  I ordered medication including Toradol /Compazine /Benadryl  for headache Reevaluation of the patient after these medicines showed that the patient improved I have reviewed the patients home medicines and have made adjustments as needed   Social Determinants of Health:  Stress   Test / Admission - Considered:  Upon review of the patient's urgent care note from earlier today, patient told the provider  there that she had a syncopal event yesterday, when asked about this she tells me that it was more that she dozed off without realizing it while sitting on the couch with her children, she denies syncope/loss of consciousness.  PERC negative. Patient initially complained of shortness of breath on exertion, I ordered chest x-ray to be completed in the emergency department today however patient declines, stating that her symptoms are only exertional.  I feel that this is a reasonable plan to forego the chest x-ray today, her lungs are clear to auscultation without adventitious sounds, no respiratory distress. Visual acuity obtained by nursing staff is as follows: Bilateral Distance: 20/40 R Distance: 20/50 L Distance: 20/40.  Upon reassessment after migraine cocktail, patient notes resolution of her headache.  Workup is largely unremarkable as above.  Given that patient does not have any focal neurologic deficits and that she has responded well to migraine cocktail, I do not feel that further imaging of her head/brain is necessary at this time.  I discussed these findings in depth with the patient who is in agreement with this plan.  I suspect that her bilateral lower extremity edema  may be dependent edema/venous insufficiency, I recommend that she continue to elevate her legs, use compression stockings, monitor salt intake.  I advised her to discuss this with her primary care provider further.  Return precautions discussed.  She is in agreement with this plan and is appropriate for discharge at this time.    Amount and/or Complexity of Data Reviewed Labs: ordered. Radiology: ordered.  Risk Prescription drug management.        Final diagnoses:  Nonintractable headache, unspecified chronicity pattern, unspecified headache type  Bilateral lower extremity edema    ED Discharge Orders     None          Glendia Rocky SAILOR, [place] 10/03/23 1400    Randol Simmonds, MD 10/05/23 [phone]

## 2023-10-03 NOTE — Discharge Instructions (Signed)
 Please follow-up with your primary care provider if your lower extremity edema persists.  For your lower extremity edema, I recommend frequent elevation of your legs, use of compression stockings, and monitoring the amount of salt you consume in your diet.  Continue ibuprofen  as needed if headache symptoms return.  Return to the emergency department if your symptoms worsen.

## 2023-10-03 NOTE — Progress Notes (Signed)
 Patient presents with complaints of headache, blurred vision, shortness of breath, left foot swelling, and LOC that occurred yesterday.  Patient is concerned about severity of symptoms and possible lab abnormalities.  Patient advised that we are limited in an Urgent Care center and cannot obtain all the testing with stat results that she likely needs.  Patient states that she will go to the ED for further and a more thorough evaluation.  Declined EMS transportation as she feels comfortable with driving 4 miles to the closest Emergency Department.    Chaperone during discussion:  Alan

## 2023-10-03 NOTE — ED Triage Notes (Signed)
 Shortness of breath Headache , fogginess , chest pain , bilateral feet edema x 1 week . Was seen at [facility] . Sent for further evaluation .  Alert and oriented x 4 , ambulatory .

## 2023-10-03 NOTE — ED Notes (Addendum)
 Pt states not feeling well for a week with H/A , bilateral swollen feet ( plus 2 ) edema , states  feels dehydrated,  has had sob has appointment with PCP on Tuesday has been nauseated and vomited
